# Patient Record
Sex: Female | Born: 1955 | Race: White | Hispanic: No | Marital: Married | Smoking: Former smoker
Health system: Southern US, Community
[De-identification: ages and names within clinical notes are randomized; demographics above are authoritative.]

## PROBLEM LIST (undated history)

## (undated) DIAGNOSIS — F909 Attention-deficit hyperactivity disorder, unspecified type: Secondary | ICD-10-CM

## (undated) DIAGNOSIS — Z5189 Encounter for other specified aftercare: Secondary | ICD-10-CM

## (undated) DIAGNOSIS — T7840XA Allergy, unspecified, initial encounter: Secondary | ICD-10-CM

## (undated) DIAGNOSIS — E039 Hypothyroidism, unspecified: Secondary | ICD-10-CM

## (undated) DIAGNOSIS — Z87442 Personal history of urinary calculi: Secondary | ICD-10-CM

## (undated) HISTORY — DX: Encounter for other specified aftercare: Z51.89

## (undated) HISTORY — PX: OTHER SURGICAL HISTORY: SHX169

## (undated) HISTORY — DX: Hypothyroidism, unspecified: E03.9

## (undated) HISTORY — DX: Personal history of urinary calculi: Z87.442

## (undated) HISTORY — DX: Attention-deficit hyperactivity disorder, unspecified type: F90.9

## (undated) HISTORY — DX: Allergy, unspecified, initial encounter: T78.40XA

---

## 1983-11-13 DIAGNOSIS — IMO0001 Reserved for inherently not codable concepts without codable children: Secondary | ICD-10-CM

## 1983-11-13 DIAGNOSIS — Z5189 Encounter for other specified aftercare: Secondary | ICD-10-CM

## 1983-11-13 HISTORY — DX: Encounter for other specified aftercare: Z51.89

## 1983-11-13 HISTORY — DX: Reserved for inherently not codable concepts without codable children: IMO0001

## 1983-11-13 HISTORY — PX: TONSILLECTOMY AND ADENOIDECTOMY: SUR1326

## 1998-07-28 ENCOUNTER — Ambulatory Visit (HOSPITAL_COMMUNITY): Admission: RE | Admit: 1998-07-28 | Discharge: 1998-07-28 | Payer: Self-pay | Admitting: Obstetrics & Gynecology

## 1998-10-17 ENCOUNTER — Other Ambulatory Visit: Admission: RE | Admit: 1998-10-17 | Discharge: 1998-10-17 | Payer: Self-pay | Admitting: Obstetrics & Gynecology

## 1998-11-18 ENCOUNTER — Other Ambulatory Visit: Admission: RE | Admit: 1998-11-18 | Discharge: 1998-11-18 | Payer: Self-pay | Admitting: Obstetrics & Gynecology

## 1999-03-28 ENCOUNTER — Other Ambulatory Visit: Admission: RE | Admit: 1999-03-28 | Discharge: 1999-03-28 | Payer: Self-pay | Admitting: Obstetrics & Gynecology

## 2000-11-01 ENCOUNTER — Encounter: Payer: Self-pay | Admitting: Internal Medicine

## 2001-09-19 ENCOUNTER — Other Ambulatory Visit: Admission: RE | Admit: 2001-09-19 | Discharge: 2001-09-19 | Payer: Self-pay | Admitting: Obstetrics and Gynecology

## 2001-09-23 ENCOUNTER — Encounter: Payer: Self-pay | Admitting: Obstetrics and Gynecology

## 2001-09-23 ENCOUNTER — Encounter: Admission: RE | Admit: 2001-09-23 | Discharge: 2001-09-23 | Payer: Self-pay | Admitting: Obstetrics and Gynecology

## 2002-01-11 ENCOUNTER — Emergency Department (HOSPITAL_COMMUNITY): Admission: EM | Admit: 2002-01-11 | Discharge: 2002-01-11 | Payer: Self-pay | Admitting: Emergency Medicine

## 2002-01-11 ENCOUNTER — Encounter: Payer: Self-pay | Admitting: Urology

## 2002-02-04 ENCOUNTER — Ambulatory Visit (HOSPITAL_BASED_OUTPATIENT_CLINIC_OR_DEPARTMENT_OTHER): Admission: RE | Admit: 2002-02-04 | Discharge: 2002-02-04 | Payer: Self-pay | Admitting: Urology

## 2002-02-04 ENCOUNTER — Encounter: Payer: Self-pay | Admitting: Urology

## 2003-02-17 ENCOUNTER — Encounter: Payer: Self-pay | Admitting: Obstetrics and Gynecology

## 2003-02-17 ENCOUNTER — Ambulatory Visit (HOSPITAL_COMMUNITY): Admission: RE | Admit: 2003-02-17 | Discharge: 2003-02-17 | Payer: Self-pay | Admitting: Obstetrics and Gynecology

## 2003-02-18 ENCOUNTER — Encounter: Payer: Self-pay | Admitting: Obstetrics and Gynecology

## 2003-02-18 ENCOUNTER — Encounter: Admission: RE | Admit: 2003-02-18 | Discharge: 2003-02-18 | Payer: Self-pay | Admitting: Obstetrics and Gynecology

## 2003-03-01 ENCOUNTER — Encounter: Admission: RE | Admit: 2003-03-01 | Discharge: 2003-03-01 | Payer: Self-pay | Admitting: Obstetrics and Gynecology

## 2003-03-01 ENCOUNTER — Encounter: Payer: Self-pay | Admitting: Obstetrics and Gynecology

## 2003-11-17 ENCOUNTER — Other Ambulatory Visit: Admission: RE | Admit: 2003-11-17 | Discharge: 2003-11-17 | Payer: Self-pay | Admitting: Obstetrics and Gynecology

## 2003-11-19 ENCOUNTER — Encounter: Admission: RE | Admit: 2003-11-19 | Discharge: 2003-11-19 | Payer: Self-pay | Admitting: Obstetrics and Gynecology

## 2004-12-06 ENCOUNTER — Ambulatory Visit: Payer: Self-pay | Admitting: Internal Medicine

## 2005-01-17 ENCOUNTER — Other Ambulatory Visit: Admission: RE | Admit: 2005-01-17 | Discharge: 2005-01-17 | Payer: Self-pay | Admitting: Obstetrics and Gynecology

## 2005-01-22 ENCOUNTER — Encounter: Admission: RE | Admit: 2005-01-22 | Discharge: 2005-01-22 | Payer: Self-pay | Admitting: Obstetrics and Gynecology

## 2005-03-12 ENCOUNTER — Ambulatory Visit: Payer: Self-pay | Admitting: Internal Medicine

## 2005-08-16 ENCOUNTER — Ambulatory Visit: Payer: Self-pay | Admitting: Internal Medicine

## 2005-08-17 ENCOUNTER — Encounter: Admission: RE | Admit: 2005-08-17 | Discharge: 2005-08-17 | Payer: Self-pay | Admitting: Internal Medicine

## 2005-10-16 ENCOUNTER — Ambulatory Visit: Payer: Self-pay | Admitting: Internal Medicine

## 2005-10-17 ENCOUNTER — Ambulatory Visit: Payer: Self-pay | Admitting: *Deleted

## 2006-02-13 ENCOUNTER — Other Ambulatory Visit: Admission: RE | Admit: 2006-02-13 | Discharge: 2006-02-13 | Payer: Self-pay | Admitting: Obstetrics and Gynecology

## 2006-04-10 ENCOUNTER — Encounter: Admission: RE | Admit: 2006-04-10 | Discharge: 2006-04-10 | Payer: Self-pay | Admitting: Obstetrics and Gynecology

## 2007-02-27 ENCOUNTER — Other Ambulatory Visit: Admission: RE | Admit: 2007-02-27 | Discharge: 2007-02-27 | Payer: Self-pay | Admitting: Obstetrics and Gynecology

## 2007-05-13 ENCOUNTER — Ambulatory Visit: Payer: Self-pay | Admitting: Internal Medicine

## 2007-05-13 LAB — CONVERTED CEMR LAB
Albumin: 3.9 g/dL (ref 3.5–5.2)
Alkaline Phosphatase: 45 units/L (ref 39–117)
BUN: 11 mg/dL (ref 6–23)
Basophils Relative: 0.7 % (ref 0.0–1.0)
Eosinophils Relative: 2.1 % (ref 0.0–5.0)
GFR calc Af Amer: 217 mL/min
GFR calc non Af Amer: 180 mL/min
HCT: 39 % (ref 36.0–46.0)
Hemoglobin: 13.3 g/dL (ref 12.0–15.0)
LDL Cholesterol: 38 mg/dL (ref 0–99)
Monocytes Absolute: 0.7 10*3/uL (ref 0.2–0.7)
Monocytes Relative: 12.2 % — ABNORMAL HIGH (ref 3.0–11.0)
Neutrophils Relative %: 50.7 % (ref 43.0–77.0)
Potassium: 4.5 meq/L (ref 3.5–5.1)
RDW: 11.4 % — ABNORMAL LOW (ref 11.5–14.6)
Sodium: 142 meq/L (ref 135–145)
VLDL: 12 mg/dL (ref 0–40)
WBC: 5.8 10*3/uL (ref 4.5–10.5)

## 2007-06-20 ENCOUNTER — Ambulatory Visit: Payer: Self-pay | Admitting: Internal Medicine

## 2007-06-20 DIAGNOSIS — E039 Hypothyroidism, unspecified: Secondary | ICD-10-CM

## 2007-06-20 DIAGNOSIS — F909 Attention-deficit hyperactivity disorder, unspecified type: Secondary | ICD-10-CM | POA: Insufficient documentation

## 2007-06-20 DIAGNOSIS — J45909 Unspecified asthma, uncomplicated: Secondary | ICD-10-CM | POA: Insufficient documentation

## 2007-06-20 DIAGNOSIS — R7309 Other abnormal glucose: Secondary | ICD-10-CM | POA: Insufficient documentation

## 2007-06-20 DIAGNOSIS — Z87442 Personal history of urinary calculi: Secondary | ICD-10-CM | POA: Insufficient documentation

## 2007-06-20 DIAGNOSIS — J309 Allergic rhinitis, unspecified: Secondary | ICD-10-CM | POA: Insufficient documentation

## 2007-06-20 HISTORY — DX: Hypothyroidism, unspecified: E03.9

## 2007-06-20 LAB — HM MAMMOGRAPHY

## 2007-08-13 ENCOUNTER — Encounter: Admission: RE | Admit: 2007-08-13 | Discharge: 2007-08-13 | Payer: Self-pay | Admitting: Obstetrics and Gynecology

## 2007-12-23 ENCOUNTER — Ambulatory Visit: Payer: Self-pay | Admitting: Internal Medicine

## 2007-12-28 LAB — CONVERTED CEMR LAB
Hgb A1c MFr Bld: 5.3 % (ref 4.6–6.0)
TSH: 0.03 microintl units/mL — ABNORMAL LOW (ref 0.35–5.50)

## 2007-12-30 ENCOUNTER — Ambulatory Visit: Payer: Self-pay | Admitting: Internal Medicine

## 2008-05-07 ENCOUNTER — Other Ambulatory Visit: Admission: RE | Admit: 2008-05-07 | Discharge: 2008-05-07 | Payer: Self-pay | Admitting: Obstetrics and Gynecology

## 2008-06-18 ENCOUNTER — Ambulatory Visit: Payer: Self-pay | Admitting: Internal Medicine

## 2008-06-18 ENCOUNTER — Telehealth: Payer: Self-pay | Admitting: *Deleted

## 2008-06-18 DIAGNOSIS — R222 Localized swelling, mass and lump, trunk: Secondary | ICD-10-CM | POA: Insufficient documentation

## 2008-06-18 DIAGNOSIS — M954 Acquired deformity of chest and rib: Secondary | ICD-10-CM | POA: Insufficient documentation

## 2008-06-21 ENCOUNTER — Telehealth: Payer: Self-pay | Admitting: Family Medicine

## 2008-07-13 ENCOUNTER — Ambulatory Visit: Payer: Self-pay | Admitting: Internal Medicine

## 2008-07-14 ENCOUNTER — Ambulatory Visit: Payer: Self-pay | Admitting: Internal Medicine

## 2009-04-26 ENCOUNTER — Encounter: Admission: RE | Admit: 2009-04-26 | Discharge: 2009-04-26 | Payer: Self-pay | Admitting: Obstetrics and Gynecology

## 2009-05-11 ENCOUNTER — Other Ambulatory Visit: Admission: RE | Admit: 2009-05-11 | Discharge: 2009-05-11 | Payer: Self-pay | Admitting: Obstetrics and Gynecology

## 2010-04-25 ENCOUNTER — Ambulatory Visit: Payer: Self-pay | Admitting: Internal Medicine

## 2010-04-26 LAB — CONVERTED CEMR LAB
AST: 27 units/L (ref 0–37)
Albumin: 4 g/dL (ref 3.5–5.2)
Basophils Absolute: 0.1 10*3/uL (ref 0.0–0.1)
Bilirubin Urine: NEGATIVE
CO2: 31 meq/L (ref 19–32)
Eosinophils Absolute: 0.1 10*3/uL (ref 0.0–0.7)
Glucose, Bld: 101 mg/dL — ABNORMAL HIGH (ref 70–99)
HDL: 83.9 mg/dL (ref >39.00)
Hemoglobin, Urine: NEGATIVE
Ketones, ur: NEGATIVE mg/dL
Leukocytes, UA: NEGATIVE
Lymphs Abs: 1.6 10*3/uL (ref 0.7–4.0)
MCV: 85.9 fL (ref 78.0–100.0)
Monocytes Absolute: 0.5 10*3/uL (ref 0.1–1.0)
Monocytes Relative: 10.1 % (ref 3.0–12.0)
Neutro Abs: 2.6 10*3/uL (ref 1.4–7.7)
Nitrite: NEGATIVE
Platelets: 251 10*3/uL (ref 150.0–400.0)
Potassium: 4.4 meq/L (ref 3.5–5.1)
RDW: 12.9 % (ref 11.5–14.6)
Sodium: 141 meq/L (ref 135–145)
Specific Gravity, Urine: >=1.03 (ref 1.000–1.030)
TSH: 0.06 microintl units/mL — ABNORMAL LOW (ref 0.35–5.50)
Total Bilirubin: 0.8 mg/dL (ref 0.3–1.2)
Total CHOL/HDL Ratio: 2
Triglycerides: 28 mg/dL (ref 0.0–149.0)
Urobilinogen, UA: 1 (ref 0.0–1.0)
VLDL: 5.6 mg/dL (ref 0.0–40.0)
WBC: 4.9 10*3/uL (ref 4.5–10.5)
pH: 6 (ref 5.0–8.0)

## 2010-05-02 ENCOUNTER — Ambulatory Visit: Payer: Self-pay | Admitting: Internal Medicine

## 2010-05-02 DIAGNOSIS — M549 Dorsalgia, unspecified: Secondary | ICD-10-CM | POA: Insufficient documentation

## 2010-10-11 ENCOUNTER — Encounter: Admission: RE | Admit: 2010-10-11 | Discharge: 2010-10-11 | Payer: Self-pay | Admitting: Obstetrics and Gynecology

## 2010-12-14 NOTE — Assessment & Plan Note (Signed)
Summary: cpx/cjr   Vital Signs:  Patient profile:   55 year old female Menstrual status:  perimenopausal LMP:     10/24/2009 Height:      65.5 inches Weight:      141 pounds BMI:     23.19 Pulse rate:   72 / minute BP sitting:   110 / 70  (right arm) Cuff size:   regular  Vitals Entered By: Romualdo Bolk, CMA (AAMA) (May 02, 2010 1:54 PM) CC: CPX no pap-Pt has gyn who does paps LMP (date): 10/24/2009     Menstrual Status perimenopausal Enter LMP: 10/24/2009 Last PAP Result normal   History of Present Illness: Misty Frederick comes in today   for preventive visit . Since last visit  here  there have been no major changes in health status  .  Thyroid:Dr [facility] yearly no recent change dose. Resp : stable   no asthma  NOw over 50 and never had colonsocopy.  Preventive Care Screening  Pap Smear:    Date:  04/12/2009    Results:  normal   Mammogram:    Date:  03/12/2009    Results:  normal   Prior Values:    Last Tetanus Booster:  Tdap (06/20/2007)   Preventive Screening-Counseling & Management  Alcohol-Tobacco     Alcohol drinks/day: 0     Smoking Status: quit     Year Quit: early 20's  Caffeine-Diet-Exercise     Caffeine use/day: less than 1 a day     Does Patient Exercise: yes     Type of exercise: Bike     Times/week: 3  Hep-HIV-STD-Contraception     Dental Visit-last 6 months yes     Sun Exposure-Excessive: no  Safety-Violence-Falls     Seat Belt Use: yes     Firearms in the Home: no firearms in the home     Smoke Detectors: yes  Current Medications (verified): 1)  Synthroid 137 Mcg Tabs (Levothyroxine Sodium)  Allergies (verified): No Known Drug Allergies  Past History:  Past Medical History: Allergic rhinitis Asthma  quiescent  Hypothyroidism Childbirth Nephrolithiasis, hx of  Peterson ADHD   ? endometriosis  CONSULTANTS   [facility]  Past History:  Care Management: Endocrinology: [facility] Gynecology: [facility] ( DR  Thomasena Edis retired  moving )  Family History: Family History of CAD Female 1st degree relative <60  Mom  Both smoked. Mom has had an amputation for PVD, AAA repari Sis with kidney stones. and PVD   smokes  sis  WPW and neice    Social History: Former remote  Smoker hhof 2    pet dogs  Regular exercise-yes single mom     Caffeine use/day:  less than 1 a day Seat Belt Use:  yes Dental Care w/in 6 mos.:  yes Sun Exposure-Excessive:  no  Review of Systems  The patient denies anorexia, fever, weight loss, weight gain, vision loss, decreased hearing, hoarseness, chest pain, syncope, dyspnea on exertion, peripheral edema, prolonged cough, abdominal pain, melena, hematochezia, severe indigestion/heartburn, hematuria, transient blindness, difficulty walking, abnormal bleeding, enlarged lymph nodes, and angioedema.         back pain  chronic   no acute problem .   no recent renal stone.    Physical Exam General Appearance: well developed, well nourished, no acute distress Eyes: conjunctiva and lids normal, PERRLA, EOMI, WNL Ears, Nose, Mouth, Throat: TM clear, nares clear, oral exam WNL Neck: supple, no lymphadenopathy, no thyromegaly, no JVD Respiratory: clear to auscultation  and percussion, respiratory effort normal Cardiovascular: regular rate and rhythm, S1-S2, no murmur, rub or gallop, no bruits, peripheral pulses normal and symmetric, no cyanosis, clubbing, edema or varicosities  no bruits noted fem abd or carotid s Chest: no scars, masses, tenderness; no asymmetry, skin changes, nipple discharge   Gastrointestinal: soft, non-tender; no hepatosplenomegaly, masses; active bowel sounds all quadrants,  Genitourinary: per gyne  Lymphatic: no cervical, axillary or inguinal adenopathy Musculoskeletal: gait normal, muscle tone and strength WNL, no joint swelling, effusions, discoloration, crepitus  Skin: clear, good turgor, color WNL, no rashes, lesions, or ulcerations Neurologic: normal mental  status, normal reflexes, normal strength, sensation, and motion Psychiatric: alert; oriented to person, place and time Other Exam:  see labs     Impression & Recommendations:  Problem # 1:  PREVENTIVE HEALTH CARE (ICD-V70.0) Discussed nutrition,exercise,diet,healthy weight, vitamin D and calcium.  need colonsocopy   Problem # 2:  HYPOTHYROIDISM (ICD-244.9)  oversuppressed ? today   send copy of labs to Dr Evlyn Kanner  for poss adjustment Her updated medication list for this problem includes:    Synthroid 137 Mcg Tabs (Levothyroxine sodium)  Labs Reviewed: TSH: 0.06 (04/25/2010)    HgBA1c: 5.3 (12/23/2007) Chol: 137 (04/25/2010)   HDL: 83.90 (04/25/2010)   LDL: 48 (04/25/2010)   TG: 28.0 (04/25/2010)  Problem # 3:  FAMILY HISTORY OF CAD FEMALE 1ST DEGREE RELATIVE <60 (ICD-V16.49) strong fam hx of  PVD mom and sib ( both smokers)  and AAA GM   NOevidence on exam today an lipids are good . could get screen via  if wishes  Problem # 4:  HYPERGLYCEMIA, FASTING (ICD-790.29) Assessment: Improved  prevention   lifestyle intervention   Complete Medication List: 1)  Synthroid 137 Mcg Tabs (Levothyroxine sodium)  Patient Instructions: 1)  Will send copy labs to Dr Evlyn Kanner   . 2)  It is important that you exercise reguarly at least 20 minutes 5 times a week. If you develop chest pain, have severe difficulty breathing, or feel very tired, stop exercising immediately and seek medical attention.  3)  Exercise  can be helpful for  memory preservation and cv health. 4)  Call  when want to do colonscopy  referral  Preventive Care Screening  Pap Smear:    Date:  04/12/2009    Results:  normal   Mammogram:    Date:  03/12/2009    Results:  normal   Prior Values:    Last Tetanus Booster:  Tdap (06/20/2007)

## 2010-12-27 ENCOUNTER — Encounter: Payer: Self-pay | Admitting: Cardiovascular Disease

## 2010-12-28 ENCOUNTER — Encounter: Payer: Self-pay | Admitting: Cardiovascular Disease

## 2010-12-28 ENCOUNTER — Ambulatory Visit (INDEPENDENT_AMBULATORY_CARE_PROVIDER_SITE_OTHER): Payer: Self-pay

## 2010-12-28 DIAGNOSIS — Z0000 Encounter for general adult medical examination without abnormal findings: Secondary | ICD-10-CM

## 2011-01-03 NOTE — Miscellaneous (Signed)
Summary: Orders Update  Clinical Lists Changes  Orders: Added new Test order of Vascular Screening  (Vas. screening) - Signed

## 2011-03-30 NOTE — Consult Note (Signed)
[place]  Patient:    Misty Frederick, Misty Frederick Visit Number: [id number] MRN: [id number]          Service Type: EMS Location: [facility] [place] Attending Physician:  Lorre Nick Dictated by:   Maretta Bees. Vonita Moss, M.D. Proc. Date: 01/11/02 Admit Date:  01/11/2002                            Consultation Report  I was asked to see this 55 year old white female at the request of Dr. Lorre Nick and his P.A. for evaluation of left flank pain.  She presented with severe pain this morning in the emergency room and was given Toradol and morphine.  She has had about a month history of some intermittent mild bladder symptoms and hematuria and she has been treated with Septra and Cipro for possible UTI by Dr. Berniece Andreas.  She did have a stone several years ago that was suspected clinically, although she never actually passed a stone.  Earlier this week there was left flank pain.  She had a CT scan at [facility] and apparently showed a 7 mm stone.  She has been taking some hydrocodone for pain.  She has had no fever through all this time.  She has had no long history of UTIs before.  She presented this morning and was admitted with severe left flank pain and a CT scan here showed a 4-5 mm stone at the L4-5 level.  I got a KUB and it showed a very faint stone about 3-4 mm in size at the level of L4.  PHYSICAL EXAMINATION  GENERAL:  She is alert and oriented.  She had some nausea, but no abdominal or CVA tenderness.  SOCIAL HISTORY:  She does not smoke or drink alcohol.  FAMILY HISTORY:  No history of stones.  ALLERGIES:  Denied.  MEDICATIONS:  None.  IMPRESSION: 1. A 4 mm left ureteral calculus with some obstruction. 2. Hematuria due to #1. 3. Rule out urinary tract infection.  PLAN:  I had a discussion with the patient and her sister and her husband and we concluded the stone is a passable sized stone.  It may be difficult to manipulate the stone  out of this level at this point.  She has no evidence of pyelonephritis.  She is very responsible in that regard.  We have chosen to get her on Mepergan Forte for pain and Vioxx as a substitute pain medication so that she could work if she had milder pain.  She will let me know if she gets continued pain or fever and will proceed endoscopically with a procedure as appropriate.  The stone is really too small and faint to be a candidate for lithotripsy.  Will also, after getting a clean catch urine culture done, send her home on cephalexin.  I will see her in the office in the middle of the week. Dictated by:   Maretta Bees. Vonita Moss, M.D. Attending Physician:  Lorre Nick DD:  01/11/02 TD:  01/12/02 Job: [id number]

## 2011-03-30 NOTE — Op Note (Signed)
[facility]  Patient:    NGOC, DETJEN Visit Number: [id number] MRN: [id number]          Service Type: EMS Location: [facility] [place] Attending Physician:  Lorre Nick Dictated by:   Maretta Bees. Vonita Moss, M.D. Proc. Date: 02/04/02 Admit Date:  01/11/2002 Discharge Date: 01/11/2002                             Operative Report  PREOPERATIVE DIAGNOSIS:  Distal left ureteral calculus.  POSTOPERATIVE DIAGNOSIS:  Distal left ureteral calculus.  PROCEDURE:  Cystoscopy, left ureteroscopy and ureteroscopic stone basketing and left retrograde pyelogram with interpretation.  SURGEON:  Maretta Bees. Vonita Moss, M.D.  ANESTHESIA:  General.  INDICATIONS FOR PROCEDURE:  This 55 year old lady has had almost a month of intermittent left flank pain and a stone initially was up at L4-5 level is now down in the distal left ureter but has been staying down for several days and she has had persistent suprapubic pressure and opted for ureteroscopic removal at this time.  DESCRIPTION OF PROCEDURE:  The patient was brought to the operating room, placed in lithotomy position, external genitalia were prepped and draped in the usual fashion. She was cystoscoped and there was some edema around the left ureteral orifice. A metal guidewire would not go up the ureter but a Glidewire M did and then I exchanged that out using an open ended ureteral catheter for a middle guidewire. I then inserted the 6 French ureteroscope and encountered the soft yellow crumbly stone and grasped the stone with a Nitinol stone basket and it broke in half and I removed 1/2 and then removed the second half and then a lot of pieces crumbled in the bladder and later I was able to retrieve some of the small fragments and give to the patient. I then reinserted the ureteroscope up to the left ureter and there were no residual stones and no injury to the ureter. I then did a left retrograde pyelogram which showed  slight fullness of the ureter but no extravasation or any other problems. I removed the ureteral catheter and then I cystoscoped her and retrieved a couple of tiny stone fragments and emptied the bladder, removed the cystoscope and sent the patient to the recovery room in good condition. Dictated by:   Maretta Bees. Vonita Moss, M.D. Attending Physician:  Lorre Nick DD:  02/04/02 TD:  02/04/02 Job: [id number]

## 2011-04-10 ENCOUNTER — Ambulatory Visit (INDEPENDENT_AMBULATORY_CARE_PROVIDER_SITE_OTHER): Payer: Self-pay | Admitting: Internal Medicine

## 2011-04-10 ENCOUNTER — Encounter: Payer: Self-pay | Admitting: Internal Medicine

## 2011-04-10 DIAGNOSIS — H9319 Tinnitus, unspecified ear: Secondary | ICD-10-CM

## 2011-04-10 MED ORDER — FLUTICASONE PROPIONATE 50 MCG/ACT NA SUSP: 2.0000 | Freq: Every day | NASAL | Status: DC

## 2011-04-15 ENCOUNTER — Encounter: Payer: Self-pay | Admitting: Internal Medicine

## 2011-04-15 DIAGNOSIS — H9319 Tinnitus, unspecified ear: Secondary | ICD-10-CM | POA: Insufficient documentation

## 2011-04-15 NOTE — Progress Notes (Signed)
Subjective:    Patient ID: Misty Frederick, female    DOB: 11/19/1955, 55 y.o.   MRN: [id number]  HPI Patient presents to clinic for evaluation of tinnitus. Notes recent onset of right ear high-pitched popping sound at match as her heartbeat. States she has had an unremarkable carotid ultrasound within the past 6 months. Has no neurologic focal symptoms. Has no increase in nasal congestion drainage and denies fever chills or recent illness. No alleviating or exacerbating factors. No other complaints.  Reviewed past medical history, medications and allergies   Review of Systems  Constitutional: Negative for fever and chills.  HENT: Positive for tinnitus. Negative for hearing loss, ear pain, congestion and facial swelling.        Objective:   Physical Exam    Physical Exam  Vitals reviewed. Constitutional:  appears well-developed and well-nourished. No distress.  HENT:  Head: Normocephalic and atraumatic.  Right Ear: Tympanic membrane, external ear and ear canal normal.  Left Ear: Tympanic membrane, external ear and ear canal normal.  Nose: Nose normal. Nasal mucosal membranes with soft tissue swelling and mild erythema. Mouth/Throat: Oropharynx is clear and moist. No oropharyngeal exudate.  Eyes: Conjunctivae and EOM are normal. Pupils are equal, round, and reactive to light. Right eye exhibits no discharge. Left eye exhibits no discharge. No scleral icterus.  Neck: Neck supple. No thyromegaly present.  Cardiovascular: Normal rate, regular rhythm and normal heart sounds.  Exam reveals no gallop and no friction rub.   No murmur heard. Pulmonary/Chest: Effort normal and breath sounds normal. No respiratory distress.  has no wheezes.  has no rales.  Lymphadenopathy:   no cervical adenopathy.  Neurological:  is alert.  Skin: Skin is warm and dry.  not diaphoretic.  Psychiatric: normal mood and affect.     Assessment & Plan:

## 2011-04-15 NOTE — Assessment & Plan Note (Signed)
Questionably pulsatile however reportedly recent carotid ultrasound unremarkable. Physical exam findings suggest possible rhinitis. Begin Flonase 2 sprays each nostril q.h.s.Followup if no improvement or worsening.

## 2011-09-17 ENCOUNTER — Encounter: Payer: Self-pay | Admitting: Internal Medicine

## 2011-09-17 ENCOUNTER — Ambulatory Visit (INDEPENDENT_AMBULATORY_CARE_PROVIDER_SITE_OTHER): Payer: Managed Care, Other (non HMO) | Admitting: Internal Medicine

## 2011-09-17 VITALS — BP 120/70 | HR 60 | Wt 151.0 lb

## 2011-09-17 DIAGNOSIS — H6980 Other specified disorders of Eustachian tube, unspecified ear: Secondary | ICD-10-CM

## 2011-09-17 DIAGNOSIS — R200 Anesthesia of skin: Secondary | ICD-10-CM

## 2011-09-17 DIAGNOSIS — E039 Hypothyroidism, unspecified: Secondary | ICD-10-CM

## 2011-09-17 DIAGNOSIS — J309 Allergic rhinitis, unspecified: Secondary | ICD-10-CM

## 2011-09-17 DIAGNOSIS — R209 Unspecified disturbances of skin sensation: Secondary | ICD-10-CM

## 2011-09-17 NOTE — Assessment & Plan Note (Signed)
Last labs show suppressed tsh

## 2011-09-17 NOTE — Patient Instructions (Signed)
If  Persisting or getting worse  Over the nex months or so then call and get an opinion.  Can try  advil aleve for 10 days or so.   Begin the allergy. flonase     For the eustachian   tube dysfunction

## 2011-09-17 NOTE — Progress Notes (Signed)
Subjective:    Patient ID: Misty Frederick, female    DOB: [birth year], 55 y.o.   MRN: [id number]  HPI Patient comes in today  For new problems evaluation. See above  last ov with me cpx 6 2011  Right anterior thigh numbness for 2-3 weeks and no motor cahnges no pain until today ? Some  twing in back at times   Feels sx  when pullls leg up.  Denies new excessive exercise and no tight binding inguinal clothes.  No weakness or  Other numbness.  Ears still feel clogged at times and pop   Had recent treatment for ear infection.  No sneezing and off her flonase . No face pain or fever .     Thyroid and labs to review   Low tsh and to be  Managed per Dr Evlyn Kanner Review of Systems No fever vision change balance issue or abd pain or inguinal or hip pain. No leg giving out.   Past history family history social history reviewed in the electronic medical record.     Objective:   Physical Exam WDWN in NAD  quiet respirations; mildly congested  somewhat hoarse. Non toxic . HEENT: Normocephalic ;atraumatic , Eyes;  PERRL, EOMs  Full, lids and conjunctiva clear,,Ears: no deformities, canals nl, TM landmarks normal, nl redness or dullness  Nose: no deformity or discharge but congested;face non tender Mouth : OP clear without lesion or edema . Neck: Supple without adenopathy or masses or bruits Chest:  Clear to A&P without wheezes rales or rhonchi CV:  S1-S2 no gallops or murmurs peripheral perfusion is normal Skin :nl perfusion and no acute rashes  No clubbing cyanosis or edema No inguinal adenopathy  Pulses seem nl  Gait nl   Points to anterior  Upper thigh area of concern.  dtrs present le and no clonus.   Labs reviewed     Assessment & Plan:  Numbness right ant thigh  Poss femoral cutaneous nerve area  No other findings. course try short course of nsaid  If  persistent or progressive can get neuro opinion.  Ears sx prob eustach tube dysfunction    allergv rhinitis hx   Restart eh flonase for  now and fu iif needed Thyroid per dr Evlyn Kanner don't think related to above

## 2011-10-30 ENCOUNTER — Other Ambulatory Visit: Payer: Self-pay | Admitting: Internal Medicine

## 2011-10-30 DIAGNOSIS — Z1231 Encounter for screening mammogram for malignant neoplasm of breast: Secondary | ICD-10-CM

## 2011-11-27 ENCOUNTER — Ambulatory Visit
Admission: RE | Admit: 2011-11-27 | Discharge: 2011-11-27 | Disposition: A | Discharge status: Home / Self Care | Payer: Managed Care, Other (non HMO) | Source: Ambulatory Visit | Attending: Internal Medicine | Admitting: Internal Medicine

## 2011-11-27 DIAGNOSIS — Z1231 Encounter for screening mammogram for malignant neoplasm of breast: Secondary | ICD-10-CM

## 2012-02-25 ENCOUNTER — Encounter: Payer: Self-pay | Admitting: Internal Medicine

## 2012-03-17 ENCOUNTER — Ambulatory Visit (AMBULATORY_SURGERY_CENTER): Payer: Managed Care, Other (non HMO) | Admitting: *Deleted

## 2012-03-17 VITALS — Ht 67.0 in | Wt 136.0 lb

## 2012-03-17 DIAGNOSIS — Z1211 Encounter for screening for malignant neoplasm of colon: Secondary | ICD-10-CM

## 2012-03-17 MED ORDER — PEG-KCL-NACL-NASULF-NA ASC-C 100 G PO SOLR: ORAL | Status: DC

## 2012-03-31 ENCOUNTER — Encounter: Payer: Self-pay | Admitting: Internal Medicine

## 2012-03-31 ENCOUNTER — Ambulatory Visit (AMBULATORY_SURGERY_CENTER): Payer: Managed Care, Other (non HMO) | Admitting: Internal Medicine

## 2012-03-31 VITALS — BP 136/74 | HR 91 | Temp 95.6°F | Resp 20 | Ht 67.0 in | Wt 136.0 lb

## 2012-03-31 DIAGNOSIS — D126 Benign neoplasm of colon, unspecified: Secondary | ICD-10-CM

## 2012-03-31 DIAGNOSIS — Z1211 Encounter for screening for malignant neoplasm of colon: Secondary | ICD-10-CM

## 2012-03-31 MED ORDER — SODIUM CHLORIDE 0.9 % IV SOLN: 500.0000 mL | INTRAVENOUS | Status: DC

## 2012-03-31 NOTE — Progress Notes (Signed)
Patient did not have preoperative order for IV antibiotic SSI prophylaxis. (G8918)  Patient did not experience any of the following events: a burn prior to discharge; a fall within the facility; wrong site/side/patient/procedure/implant event; or a hospital transfer or hospital admission upon discharge from the facility. (G8907)  

## 2012-03-31 NOTE — Patient Instructions (Signed)
YOU HAD AN ENDOSCOPIC PROCEDURE TODAY AT THE [facility]: Refer to the procedure report that was given to you for any specific questions about what was found during the examination.  If the procedure report does not answer your questions, please call your gastroenterologist to clarify.  If you requested that your care partner not be given the details of your procedure findings, then the procedure report has been included in a sealed envelope for you to review at your convenience later.  YOU SHOULD EXPECT: Some feelings of bloating in the abdomen. Passage of more gas than usual.  Walking can help get rid of the air that was put into your GI tract during the procedure and reduce the bloating. If you had a lower endoscopy (such as a colonoscopy or flexible sigmoidoscopy) you may notice spotting of blood in your stool or on the toilet paper. If you underwent a bowel prep for your procedure, then you may not have a normal bowel movement for a few days.  DIET: Your first meal following the procedure should be a light meal and then it is ok to progress to your normal diet.  A half-sandwich or bowl of soup is an example of a good first meal.  Heavy or fried foods are harder to digest and may make you feel nauseous or bloated.  Likewise meals heavy in dairy and vegetables can cause extra gas to form and this can also increase the bloating.  Drink plenty of fluids but you should avoid alcoholic beverages for 24 hours.  ACTIVITY: Your care partner should take you home directly after the procedure.  You should plan to take it easy, moving slowly for the rest of the day.  You can resume normal activity the day after the procedure however you should NOT DRIVE or use heavy machinery for 24 hours (because of the sedation medicines used during the test).    SYMPTOMS TO REPORT IMMEDIATELY: A gastroenterologist can be reached at any hour.  During normal business hours, 8:30 AM to 5:00 PM Monday through Friday,  call [phone].  After hours and on weekends, please call the GI answering service at [phone] who will take a message and have the physician on call contact you.   Following lower endoscopy (colonoscopy or flexible sigmoidoscopy):  Excessive amounts of blood in the stool  Significant tenderness or worsening of abdominal pains  Swelling of the abdomen that is new, acute  Fever of 100F or higher    FOLLOW UP: If any biopsies were taken you will be contacted by phone or by letter within the next 1-3 weeks.  Call your gastroenterologist if you have not heard about the biopsies in 3 weeks.  Our staff will call the home number listed on your records the next business day following your procedure to check on you and address any questions or concerns that you may have at that time regarding the information given to you following your procedure. This is a courtesy call and so if there is no answer at the home number and we have not heard from you through the emergency physician on call, we will assume that you have returned to your regular daily activities without incident.  SIGNATURES/CONFIDENTIALITY: You and/or your care partner have signed paperwork which will be entered into your electronic medical record.  These signatures attest to the fact that that the information above on your After Visit Summary has been reviewed and is understood.  Full responsibility of the confidentiality   of this discharge information lies with you and/or your care-partner.     

## 2012-03-31 NOTE — Op Note (Signed)
[facility] [address] [place], [place]  [address]  COLONOSCOPY PROCEDURE REPORT  PATIENT:  Misty Frederick, Misty Frederick  MR#:  [id number] BIRTHDATE:  Apr 29, 1956, 55 yrs. old  GENDER:  female ENDOSCOPIST:  Wilhemina Bonito. Eda Keys, MD REF. BY:  .Direct / Self, PROCEDURE DATE:  03/31/2012 PROCEDURE:  Colonoscopy with snare polypectomy x 1 ASA CLASS:  Class II INDICATIONS:  Routine Risk Screening MEDICATIONS:   MAC sedation, administered by CRNA, propofol (Diprivan) 280 mg IV  DESCRIPTION OF PROCEDURE:   After the risks benefits and alternatives of the procedure were thoroughly explained, informed consent was obtained.  Digital rectal exam was performed and revealed no abnormalities.   The LB CF-[id number] endoscope was introduced through the anus and advanced to the cecum, which was identified by both the appendix and ileocecal valve, without limitations.  The quality of the prep was excellent, using MoviPrep.  The instrument was then slowly withdrawn as the colon was fully examined. <<PROCEDUREIMAGES>>  FINDINGS:  A51mm sessile polyp was found in the cecum and snared without cautery. Retrieval was successful.  Otherwise normal colonoscopy without other polyps, masses, vascular ectasias, or inflammatory changes.   Retroflexed views in the rectum revealed no abnormalities.    The time to cecum = 3:22  minutes. The scope was then withdrawn in  14:07  minutes from the cecum and the procedure completed.  COMPLICATIONS:  None  ENDOSCOPIC IMPRESSION: 1) 10mm Sessile polyp in the cecum - removed 2) Otherwise normal colonoscopy  RECOMMENDATIONS: 1) Repeat Colonoscopy in 3 years.  ______________________________ Wilhemina Bonito. Eda Keys, MD  CC:  Madelin Headings, MD;  The Patient  n. eSIGNED:   Wilhemina Bonito. Eda Keys at 03/31/2012 03:33 PM  Ramond Craver, [id number]

## 2012-04-01 ENCOUNTER — Telehealth: Payer: Self-pay

## 2012-04-01 NOTE — Telephone Encounter (Signed)
  Follow up Call-  Call back number 03/31/2012  Post procedure Call Back phone  # [phone]  Permission to leave phone message Yes     Patient questions:  Do you have a fever, pain , or abdominal swelling? no Pain Score  0 *  Have you tolerated food without any problems? yes  Have you been able to return to your normal activities? yes  Do you have any questions about your discharge instructions: Diet   no Medications  no Follow up visit  no  Do you have questions or concerns about your Care? no  Actions: * If pain score is 4 or above: No action needed, pain <4.

## 2012-04-08 ENCOUNTER — Encounter: Payer: Self-pay | Admitting: Internal Medicine

## 2012-05-07 ENCOUNTER — Other Ambulatory Visit (INDEPENDENT_AMBULATORY_CARE_PROVIDER_SITE_OTHER): Payer: Managed Care, Other (non HMO)

## 2012-05-07 DIAGNOSIS — Z0000 Encounter for general adult medical examination without abnormal findings: Secondary | ICD-10-CM

## 2012-05-07 LAB — LIPID PANEL
HDL: 93.8 mg/dL (ref >39.00)
Triglycerides: 21 mg/dL (ref 0.0–149.0)

## 2012-05-07 LAB — POCT URINALYSIS DIPSTICK
Bilirubin, UA: NEGATIVE
Blood, UA: NEGATIVE
Ketones, UA: NEGATIVE
Leukocytes, UA: NEGATIVE
pH, UA: 5.5

## 2012-05-07 LAB — BASIC METABOLIC PANEL
CO2: 31 mEq/L (ref 19–32)
Chloride: 106 mEq/L (ref 96–112)
Creatinine, Ser: 0.7 mg/dL (ref 0.4–1.2)
Sodium: 142 mEq/L (ref 135–145)

## 2012-05-07 LAB — CBC WITH DIFFERENTIAL/PLATELET
Basophils Relative: 1.1 % (ref 0.0–3.0)
Eosinophils Absolute: 0.1 10*3/uL (ref 0.0–0.7)
Hemoglobin: 13.2 g/dL (ref 12.0–15.0)
MCHC: 33 g/dL (ref 30.0–36.0)
MCV: 86.3 fl (ref 78.0–100.0)
Monocytes Absolute: 0.4 10*3/uL (ref 0.1–1.0)
Neutro Abs: 2.2 10*3/uL (ref 1.4–7.7)
RBC: 4.64 Mil/uL (ref 3.87–5.11)

## 2012-05-07 LAB — HEPATIC FUNCTION PANEL
Albumin: 3.9 g/dL (ref 3.5–5.2)
Total Protein: 6.5 g/dL (ref 6.0–8.3)

## 2012-05-13 ENCOUNTER — Encounter: Payer: Self-pay | Admitting: Internal Medicine

## 2012-05-13 ENCOUNTER — Ambulatory Visit (INDEPENDENT_AMBULATORY_CARE_PROVIDER_SITE_OTHER): Payer: Managed Care, Other (non HMO) | Admitting: Internal Medicine

## 2012-05-13 VITALS — BP 110/60 | HR 80 | Temp 98.4°F | Ht 65.75 in | Wt 137.0 lb

## 2012-05-13 DIAGNOSIS — Z8249 Family history of ischemic heart disease and other diseases of the circulatory system: Secondary | ICD-10-CM

## 2012-05-13 DIAGNOSIS — Z86010 Personal history of colonic polyps: Secondary | ICD-10-CM

## 2012-05-13 DIAGNOSIS — E039 Hypothyroidism, unspecified: Secondary | ICD-10-CM

## 2012-05-13 DIAGNOSIS — Z0000 Encounter for general adult medical examination without abnormal findings: Secondary | ICD-10-CM

## 2012-05-13 NOTE — Patient Instructions (Signed)
Your laboratory studies are good today. Healthy eating exercise and sleep. Recheck in one year or as needed we'll send a copy of her laboratory tests to Dr. Evlyn Kanner.

## 2012-05-13 NOTE — Progress Notes (Signed)
Subjective:    Patient ID: Misty Frederick, female    DOB: 1956/06/07, 56 y.o.   MRN: [id number]  HPI Patient comes in today for Preventive Health Care visit  No major change in health status since last visit . See ear issue  Wants to have check  No pain . Sees dr Evlyn Kanner for thyroid on meds    Review of Systems ROS:  GEN/ HEENT: No fever, significant weight changes sweats headaches vision problems hearing changes, CV/ PULM; No chest pain shortness of breath cough, syncope,edema  change in exercise tolerance. GI /GU: No adominal pain, vomiting, change in bowel habits. No blood in the stool. No significant GU symptoms. SKIN/HEME: ,no acute skin rashes suspicious lesions or bleeding. No lymphadenopathy, nodules, masses.  NEURO/ PSYCH:  No neurologic signs such as weakness numbness. No depression anxiety. IMM/ Allergy: No unusual infections.  Allergy .   REST of 12 system review negative except as per HPI Outpatient Encounter Prescriptions as of 05/13/2012  Medication Sig Dispense Refill  . levothyroxine (SYNTHROID, LEVOTHROID) 137 MCG tablet Take 112 mcg by mouth daily.       Marland Kitchen DISCONTD: fluticasone (FLONASE) 50 MCG/ACT nasal spray Place 2 sprays into the nose daily.  16 g  2   Past history family history social history reviewed in the electronic medical record.      Objective:   Physical Exam BP 110/60  Pulse 80  Temp 98.4 F (36.9 C) (Oral)  Ht 5' 5.75" (1.67 m)  Wt 137 lb (62.143 kg)  BMI 22.28 kg/m2  SpO2 96% Physical Exam: Vital signs reviewed OZD:GUYQ is a well-developed well-nourished alert cooperative  white female who appears her stated age in no acute distress.  HEENT: normocephalic atraumatic , Eyes: PERRL EOM's full, conjunctiva clear, Nares: paten,t no deformity discharge or tenderness., Ears: no deformity EAC's clear TMs with normal landmarks. Mouth: clear OP, no lesions, edema.  Moist mucous membranes. Dentition in adequate repair. NECK: supple without masses,  thyromegaly or bruits. CHEST/PULM:  Clear to auscultation and percussion breath sounds equal no wheeze , rales or rhonchi. No chest wall deformities or tenderness. CV: PMI is nondisplaced, S1 S2 no gallops, murmurs, rubs. Peripheral pulses are full without delay.No JVD .  ABDOMEN: Bowel sounds normal nontender  No guard or rebound, no hepato splenomegal no CVA tenderness.  No hernia. Extremtities:  No clubbing cyanosis or edema, no acute joint swelling or redness no focal atrophy NEURO:  Oriented x3, cranial nerves 3-12 appear to be intact, no obvious focal weakness,gait within normal limits no abnormal reflexes or asymmetrical SKIN: No acute rashes normal turgor, color, no bruising or petechiae. PSYCH: Oriented, good eye contact, no obvious depression anxiety, cognition and judgment appear normal. LN: no cervical axillary inguinal adenopathy    Lab Results  Component Value Date   WBC 4.4* 05/07/2012   HGB 13.2 05/07/2012   HCT 40.1 05/07/2012   PLT 240.0 05/07/2012   GLUCOSE 89 05/07/2012   CHOL 144 05/07/2012   TRIG 21.0 05/07/2012   HDL 93.80 05/07/2012   LDLCALC 46 05/07/2012   ALT 24 05/07/2012   AST 21 05/07/2012   NA 142 05/07/2012   K 4.2 05/07/2012   CL 106 05/07/2012   CREATININE 0.7 05/07/2012   BUN 15 05/07/2012   CO2 31 05/07/2012   TSH 1.06 05/07/2012   HGBA1C 5.3 12/23/2007       Assessment & Plan:  Preventive Health Care Counseled regarding healthy nutrition, exercise, sleep, injury prevention, calcium vit  d and healthy weight .   utd on colon and pap.  Pt sated that she only smoke 1/2ppd from age 68- 28 in HS and is not a chronic smoker  . explain the way  I think the data boxes work in [employer] . THyroid per dr Evlyn Kanner Hx of hyperglycemia   Good today  Continue lsi Family hx of PVD .

## 2012-05-18 ENCOUNTER — Encounter: Payer: Self-pay | Admitting: Internal Medicine

## 2012-05-18 DIAGNOSIS — Z860100 Personal history of colon polyps, unspecified: Secondary | ICD-10-CM | POA: Insufficient documentation

## 2012-05-18 DIAGNOSIS — Z0000 Encounter for general adult medical examination without abnormal findings: Secondary | ICD-10-CM | POA: Insufficient documentation

## 2012-05-18 DIAGNOSIS — Z8249 Family history of ischemic heart disease and other diseases of the circulatory system: Secondary | ICD-10-CM | POA: Insufficient documentation

## 2012-08-07 ENCOUNTER — Other Ambulatory Visit: Payer: Self-pay | Admitting: Obstetrics and Gynecology

## 2012-08-22 ENCOUNTER — Ambulatory Visit (INDEPENDENT_AMBULATORY_CARE_PROVIDER_SITE_OTHER): Payer: Managed Care, Other (non HMO) | Admitting: Internal Medicine

## 2012-08-22 ENCOUNTER — Encounter: Payer: Self-pay | Admitting: Internal Medicine

## 2012-08-22 VITALS — BP 122/70 | HR 80 | Temp 98.4°F | Wt 137.0 lb

## 2012-08-22 DIAGNOSIS — Z87442 Personal history of urinary calculi: Secondary | ICD-10-CM

## 2012-08-22 DIAGNOSIS — R109 Unspecified abdominal pain: Secondary | ICD-10-CM

## 2012-08-22 DIAGNOSIS — R82998 Other abnormal findings in urine: Secondary | ICD-10-CM

## 2012-08-22 DIAGNOSIS — J45909 Unspecified asthma, uncomplicated: Secondary | ICD-10-CM

## 2012-08-22 DIAGNOSIS — R21 Rash and other nonspecific skin eruption: Secondary | ICD-10-CM

## 2012-08-22 DIAGNOSIS — T783XXA Angioneurotic edema, initial encounter: Secondary | ICD-10-CM

## 2012-08-22 DIAGNOSIS — R8290 Unspecified abnormal findings in urine: Secondary | ICD-10-CM

## 2012-08-22 LAB — POCT URINALYSIS DIPSTICK
Glucose, UA: NEGATIVE
Spec Grav, UA: 1.02
Urobilinogen, UA: 0.2

## 2012-08-22 MED ORDER — METHYLPREDNISOLONE ACETATE 80 MG/ML IJ SUSP
120.0000 mg | Freq: Once | INTRAMUSCULAR | Status: AC
  Administered 2012-08-22: 120 mg via INTRAMUSCULAR

## 2012-08-22 NOTE — Patient Instructions (Addendum)
I agree this is an allergic reaction. Continue on the Benadryl 25 mg every 4-6 hours to avoid breakthrough you could take 50 mg at night but will make you extra drowsy.  Steroid injection today. Do not take this medication in the future.  Delay the flu shot because we are giving the steroids today. Contact medical help if you're getting shortness of breath coughing increasing swelling but I suspect that we'll get better by the end of the weekend.  Allergic Reaction, Mild to Moderate Allergies may happen from anything your body is sensitive to. This may be food, medications, pollens, chemicals, and nearly anything around you in everyday life that produces allergens. An allergen is anything that causes an allergy producing substance. Allergens cause your body to release allergic antibodies. Through a chain of events, they cause a release of histamine into the blood stream. Histamines are meant to protect you, but they also cause your discomfort. This is why antihistamines are often used for allergies. Heredity is often a factor in causing allergic reactions. This means you may have some of the same allergies as your parents. Allergies happen in all age groups. You may have some idea of what caused your reaction. There are many allergens around [place]. It may be difficult to know what caused your reaction. If this is a first time event, it may never happen again. Allergies cannot be cured but can be controlled with medications. SYMPTOMS  You may get some or all of the following problems from allergies.  Swelling and itching in and around the mouth.   Tearing, itchy eyes.   Nasal congestion and runny nose.   Sneezing and coughing.   An itchy red rash or hives.   Vomiting or diarrhea.   Difficulty breathing.  Seasonal allergies occur in all age groups. They are seasonal because they usually occur during the same season every year. They may be a reaction to molds, grass pollens, or tree pollens. Other  causes of allergies are house dust mite allergens, pet dander and mold spores. These are just a common few of the thousands of allergens around [place]. All of the symptoms listed above happen when you come in contact with pollens and other allergens. Seasonal allergies are usually not life threatening. They are generally more of a nuisance that can often be handled using medications. Hay fever is a combination of all or some of the above listed allergy problems. It may often be treated with simple over-the-counter medications such as diphenhydramine. Take medication as directed. Check with your caregiver or package insert for child dosages. TREATMENT AND HOME CARE INSTRUCTIONS If hives or rash are present:  Take medications as directed.   You may use an over-the-counter antihistamine (diphenhydramine) for hives and itching as needed. Do not drive or drink alcohol until medications used to treat the reaction have worn off. Antihistamines tend to make people sleepy.   Apply cold cloths (compresses) to the skin or take baths in cool water. This will help itching. Avoid hot baths or showers. Heat will make a rash and itching worse.   If your allergies persist and become more severe, and over the counter medications are not effective, there are many new medications your caretaker can prescribe. Immunotherapy or desensitizing injections can be used if all else fails. Follow up with your caregiver if problems continue.  SEEK MEDICAL CARE IF:   Your allergies are becoming progressively more troublesome.   You suspect a food allergy. Symptoms generally happen within 30 minutes  of eating a food.   Your symptoms have not gone away within 2 days or are getting worse.   You develop new symptoms.   You want to retest yourself or your child with a food or drink you think causes an allergic reaction. Never test yourself or your child of a suspected allergy without being under the watchful eye of your caregivers. A  second exposure to an allergen may be life-threatening.  SEEK IMMEDIATE MEDICAL CARE IF:  You develop difficulty breathing or wheezing, or have a tight feeling in your chest or throat.   You develop a swollen mouth, hives, swelling, or itching all over your body.  A severe reaction with any of the above problems should be considered life-threatening. If you suddenly develop difficulty breathing call for local emergency medical help. THIS IS AN EMERGENCY. MAKE SURE YOU:   Understand these instructions.   Will watch your condition.   Will get help right away if you are not doing well or get worse.  Document Released: 08/26/2007 Document Revised: 10/18/2011 Document Reviewed: 08/26/2007 [facility] Patient Information 2012 [place], [place].

## 2012-08-23 ENCOUNTER — Encounter: Payer: Self-pay | Admitting: Internal Medicine

## 2012-08-23 DIAGNOSIS — T783XXA Angioneurotic edema, initial encounter: Secondary | ICD-10-CM | POA: Insufficient documentation

## 2012-08-23 DIAGNOSIS — R8290 Unspecified abnormal findings in urine: Secondary | ICD-10-CM | POA: Insufficient documentation

## 2012-08-23 NOTE — Progress Notes (Signed)
Subjective:    Patient ID: Misty Frederick, female    DOB: 1955/11/20, 56 y.o.   MRN: [id number]  HPI Patient comes in today for SDA for  new acute problem evaluation. ONset as above itchy hive type rash and had lip swelling in past 24 hours that got better with benadryl 255 mg  Rash is waxing and waning   But worse in the past 24 hours . No sob but feels not right in chest no wheezing unsure if any throat sx as is very aware of reaction. Asthma seems stable  Never had reaction like this doesn't take much med  Was seen by GYNE and had pap done nl noted blood in UA and was given macrobid 100 bid for 7 days  Last day was 36-48 hours ago and began having itching all over ; then rash and lip swelling as above.  Review of Systems No sob wheeze fever chills or gross hematuria?   Hx of stones non recently has some reflux like chest pain  Past history family history social history reviewed in the electronic medical record. No meds otherwise except synthroid   Objective:   Physical Exam BP 122/70  Pulse 80  Temp 98.4 F (36.9 C) (Oral)  Wt 137 lb (62.143 kg)  SpO2 97%' WDWN in nad   HEENT: Normocephalic ;atraumatic , Eyes;  PERRL, EOMs  Full, lids and conjunctiva clear,,Ears: no deformities, canals nl, TM landmarks normal, Nose: no deformity or discharge  Mouth : OP clear without lesion or edema . Neck: Supple without adenopathy or masses or bruits. Chest:  Clear to A&P without wheezes rales or rhonchi CV:  S1-S2 no gallops or murmurs peripheral perfusion is normal Abdomen:  Sof,t normal bowel sounds without hepatosplenomegaly, no guarding rebound or masses no CVA tenderness SKIN hive type rash arms trunk and neck current non on face.  No bruising petechia. No clubbing cyanosis or edema  UA 1+ leuk  Will do UR cx    Assessment & Plan:  Allergic reaction with angioedema  presumed from macrobid    Responsive to benadryl but because of severity and swelling hx  depomedrol today and continue  benadryl  And  close monitor   See care with alarm features  Or if not getting better after weekend .  Abn UA  Poss insig without sx   Sp rx for  uti from gyne  ? If ever had a sx or just abd UA  Urine culture pending  Hx of renal stone stable no sx of such today  Hx of asthma seems stable

## 2012-08-24 LAB — URINE CULTURE
Colony Count: NO GROWTH
Organism ID, Bacteria: NO GROWTH

## 2012-08-26 ENCOUNTER — Encounter: Payer: Self-pay | Admitting: Family Medicine

## 2012-12-27 ENCOUNTER — Other Ambulatory Visit: Payer: Self-pay

## 2013-04-07 ENCOUNTER — Ambulatory Visit (INDEPENDENT_AMBULATORY_CARE_PROVIDER_SITE_OTHER): Payer: Managed Care, Other (non HMO) | Admitting: Internal Medicine

## 2013-04-07 ENCOUNTER — Encounter: Payer: Self-pay | Admitting: Internal Medicine

## 2013-04-07 VITALS — BP 100/70 | HR 67 | Temp 98.1°F | Wt 139.0 lb

## 2013-04-07 DIAGNOSIS — M79609 Pain in unspecified limb: Secondary | ICD-10-CM

## 2013-04-07 DIAGNOSIS — M79606 Pain in leg, unspecified: Secondary | ICD-10-CM | POA: Insufficient documentation

## 2013-04-07 DIAGNOSIS — B029 Zoster without complications: Secondary | ICD-10-CM

## 2013-04-07 DIAGNOSIS — M79604 Pain in right leg: Secondary | ICD-10-CM

## 2013-04-07 MED ORDER — VALACYCLOVIR HCL 1 G PO TABS: 1000.0000 mg | ORAL_TABLET | Freq: Three times a day (TID) | ORAL | Status: DC

## 2013-04-07 NOTE — Patient Instructions (Signed)
I agree this acts like  early shingles. Take the Valtrex as soon as possible four-week ibuprofen for pain is fine.  If the pain is not adequately controlled contact her office we can consider using Lyrica or Neurontin for pain.   Shingles Shingles (herpes zoster) is an infection that is caused by the same virus that causes chickenpox (varicella). The infection causes a painful skin rash and fluid-filled blisters, which eventually break open, crust over, and heal. It may occur in any area of the body, but it usually affects only one side of the body or face. The pain of shingles usually lasts about 1 month. However, some people with shingles may develop long-term (chronic) pain in the affected area of the body. Shingles often occurs many years after the person had chickenpox. It is more common:  In people older than 50 years.  In people with weakened immune systems, such as those with HIV, AIDS, or cancer.  In people taking medicines that weaken the immune system, such as transplant medicines.  In people under great stress. CAUSES  Shingles is caused by the varicella zoster virus (VZV), which also causes chickenpox. After a person is infected with the virus, it can remain in the person's body for years in an inactive state (dormant). To cause shingles, the virus reactivates and breaks out as an infection in a nerve root. The virus can be spread from person to person (contagious) through contact with open blisters of the shingles rash. It will only spread to people who have not had chickenpox. When these people are exposed to the virus, they may develop chickenpox. They will not develop shingles. Once the blisters scab over, the person is no longer contagious and cannot spread the virus to others. SYMPTOMS  Shingles shows up in stages. The initial symptoms may be pain, itching, and tingling in an area of the skin. This pain is usually described as burning, stabbing, or throbbing.In a few days or  weeks, a painful red rash will appear in the area where the pain, itching, and tingling were felt. The rash is usually on one side of the body in a band or belt-like pattern. Then, the rash usually turns into fluid-filled blisters. They will scab over and dry up in approximately 2 3 weeks. Flu-like symptoms may also occur with the initial symptoms, the rash, or the blisters. These may include:  Fever.  Chills.  Headache.  Upset stomach. DIAGNOSIS  Your caregiver will perform a skin exam to diagnose shingles. Skin scrapings or fluid samples may also be taken from the blisters. This sample will be examined under a microscope or sent to a lab for further testing. TREATMENT  There is no specific cure for shingles. Your caregiver will likely prescribe medicines to help you manage the pain, recover faster, and avoid long-term problems. This may include antiviral drugs, anti-inflammatory drugs, and pain medicines. HOME CARE INSTRUCTIONS   Take a cool bath or apply cool compresses to the area of the rash or blisters as directed. This may help with the pain and itching.   Only take over-the-counter or prescription medicines as directed by your caregiver.   Rest as directed by your caregiver.  Keep your rash and blisters clean with mild soap and cool water or as directed by your caregiver.  Do not pick your blisters or scratch your rash. Apply an anti-itch cream or numbing creams to the affected area as directed by your caregiver.  Keep your shingles rash covered with a loose  bandage (dressing).  Avoid skin contact with:  Babies.   Pregnant women.   Children with eczema.   Elderly people with transplants.   People with chronic illnesses, such as leukemia or AIDS.   Wear loose-fitting clothing to help ease the pain of material rubbing against the rash.  Keep all follow-up appointments with your caregiver.If the area involved is on your face, you may receive a referral for  follow-up to a specialist, such as an eye doctor (ophthalmologist) or an ear, nose, and throat (ENT) doctor. Keeping all follow-up appointments will help you avoid eye complications, chronic pain, or disability.  SEEK IMMEDIATE MEDICAL CARE IF:   You have facial pain, pain around the eye area, or loss of feeling on one side of your face.  You have ear pain or ringing in your ear.  You have loss of taste.  Your pain is not relieved with prescribed medicines.   Your redness or swelling spreads.   You have more pain and swelling.  Your condition is worsening or has changed.   You have a feveror persistent symptoms for more than 2 3 days.  You have a fever and your symptoms suddenly get worse. MAKE SURE YOU:  Understand these instructions.  Will watch your condition.  Will get help right away if you are not doing well or get worse. Document Released: 10/29/2005 Document Revised: 07/23/2012 Document Reviewed: 06/12/2012 [facility] Patient Information 2014 [place], [place].

## 2013-04-07 NOTE — Progress Notes (Signed)
Chief Complaint  Patient presents with  . Questionable shingles    Rash on Rt leg with burning and tingling sensations throughout the leg.  Started 4-5 days ago.    HPI: Patient comes in today for SDA for  new problem evaluation. Onset of burning radiating type pain down her right thigh and right lateral leg. For a number of days 4-5 days felt achy and some nausea last week.  Now she has broken out with a few patches of rash on her right  thigh bumps. No medicines treated no history of shingles. No UTI symptoms back symptoms other neurologic symptoms vaginal discharge history of HSV.  ROS: See pertinent positives and negatives per HPI.  Past Medical History  Diagnosis Date  . Allergy   . Asthma   . History of nephrolithiasis   . ADHD (attention deficit hyperactivity disorder)   . Endometriosis   . Blood transfusion 1985  . HYPOTHYROIDISM 06/20/2007    Qualifier: Diagnosis of  By: Lawernce Ion, CMA (AAMA), Bethann Berkshire Per dr [facility] History  Problem Relation Age of Onset  . Coronary artery disease Mother     mom less than age 23 smoked  pvd aaa amputation  . Nephrolithiasis Sister   . Evelene Croon Parkinson White syndrome Sister     and niece  . Peripheral vascular disease      mom sis tobacco    History   Social History  . Marital Status: Married    Spouse Name: N/A    Number of Children: N/A  . Years of Education: N/A   Social History Main Topics  . Smoking status: Former Games developer  . Smokeless tobacco: Never Used  . Alcohol Use: No  . Drug Use: No  . Sexually Active: None   Other Topics Concern  . None   Social History Narrative   hh of 1 +1    2 dogs    Single mom    Nef ets etoh     Outpatient Encounter Prescriptions as of 04/07/2013  Medication Sig Dispense Refill  . levothyroxine (SYNTHROID) 112 MCG tablet Take 112 mcg by mouth daily before breakfast. Brand Name Only      . valACYclovir (VALTREX) 1000 MG tablet Take 1 tablet (1,000 mg total) by mouth 3  (three) times daily.  21 tablet  0  . [DISCONTINUED] levothyroxine (SYNTHROID, LEVOTHROID) 137 MCG tablet Take 112 mcg by mouth daily.        No facility-administered encounter medications on file as of 04/07/2013.    EXAM:  BP 100/70  Pulse 67  Temp(Src) 98.1 F (36.7 C) (Oral)  Wt 139 lb (63.05 kg)  BMI 22.61 kg/m2  SpO2 98%  Body mass index is 22.61 kg/(m^2).  GENERAL: vitals reviewed and listed above, alert, oriented, appears well hydrated and in no acute distress  HEENT: atraumatic, conjunctiva  clear, no obvious abnormalities on inspection of external nose and ears  CV: HRRR, no clubbing cyanosis or  peripheral edema nl cap refill  Right lower extremity no acute changes or swelling there are 2 patches of pink red bumps that look like early vesicles in the right anterior medial thigh gait is within normal limits no adenopathy no atrophy. MS: moves all extremities without noticeable focal  abnormality PSYCH: pleasant and cooperative, no obvious depression or anxiety  ASSESSMENT AND PLAN:  Discussed the following assessment and plan:  Thigh shingles - Highly suspected discussed progression empiric treatment with Valtrex expectant management contact us for  pain management if needed consider gabapentin or Lyric If this is not shingles as to resolve on its own otherwise I do not think the Detrol infection and no other alarm features. -Patient advised to return or notify health care team  if symptoms worsen or persist or new concerns arise.  Patient Instructions  I agree this acts like  early shingles. Take the Valtrex as soon as possible four-week ibuprofen for pain is fine.  If the pain is not adequately controlled contact her office we can consider using Lyrica or Neurontin for pain.   Shingles Shingles (herpes zoster) is an infection that is caused by the same virus that causes chickenpox (varicella). The infection causes a painful skin rash and fluid-filled blisters, which  eventually break open, crust over, and heal. It may occur in any area of the body, but it usually affects only one side of the body or face. The pain of shingles usually lasts about 1 month. However, some people with shingles may develop long-term (chronic) pain in the affected area of the body. Shingles often occurs many years after the person had chickenpox. It is more common:  In people older than 50 years.  In people with weakened immune systems, such as those with HIV, AIDS, or cancer.  In people taking medicines that weaken the immune system, such as transplant medicines.  In people under great stress. CAUSES  Shingles is caused by the varicella zoster virus (VZV), which also causes chickenpox. After a person is infected with the virus, it can remain in the person's body for years in an inactive state (dormant). To cause shingles, the virus reactivates and breaks out as an infection in a nerve root. The virus can be spread from person to person (contagious) through contact with open blisters of the shingles rash. It will only spread to people who have not had chickenpox. When these people are exposed to the virus, they may develop chickenpox. They will not develop shingles. Once the blisters scab over, the person is no longer contagious and cannot spread the virus to others. SYMPTOMS  Shingles shows up in stages. The initial symptoms may be pain, itching, and tingling in an area of the skin. This pain is usually described as burning, stabbing, or throbbing.In a few days or weeks, a painful red rash will appear in the area where the pain, itching, and tingling were felt. The rash is usually on one side of the body in a band or belt-like pattern. Then, the rash usually turns into fluid-filled blisters. They will scab over and dry up in approximately 2 3 weeks. Flu-like symptoms may also occur with the initial symptoms, the rash, or the blisters. These may  include:  Fever.  Chills.  Headache.  Upset stomach. DIAGNOSIS  Your caregiver will perform a skin exam to diagnose shingles. Skin scrapings or fluid samples may also be taken from the blisters. This sample will be examined under a microscope or sent to a lab for further testing. TREATMENT  There is no specific cure for shingles. Your caregiver will likely prescribe medicines to help you manage the pain, recover faster, and avoid long-term problems. This may include antiviral drugs, anti-inflammatory drugs, and pain medicines. HOME CARE INSTRUCTIONS   Take a cool bath or apply cool compresses to the area of the rash or blisters as directed. This may help with the pain and itching.   Only take over-the-counter or prescription medicines as directed by your caregiver.   Rest as directed by  your caregiver.  Keep your rash and blisters clean with mild soap and cool water or as directed by your caregiver.  Do not pick your blisters or scratch your rash. Apply an anti-itch cream or numbing creams to the affected area as directed by your caregiver.  Keep your shingles rash covered with a loose bandage (dressing).  Avoid skin contact with:  Babies.   Pregnant women.   Children with eczema.   Elderly people with transplants.   People with chronic illnesses, such as leukemia or AIDS.   Wear loose-fitting clothing to help ease the pain of material rubbing against the rash.  Keep all follow-up appointments with your caregiver.If the area involved is on your face, you may receive a referral for follow-up to a specialist, such as an eye doctor (ophthalmologist) or an ear, nose, and throat (ENT) doctor. Keeping all follow-up appointments will help you avoid eye complications, chronic pain, or disability.  SEEK IMMEDIATE MEDICAL CARE IF:   You have facial pain, pain around the eye area, or loss of feeling on one side of your face.  You have ear pain or ringing in your  ear.  You have loss of taste.  Your pain is not relieved with prescribed medicines.   Your redness or swelling spreads.   You have more pain and swelling.  Your condition is worsening or has changed.   You have a feveror persistent symptoms for more than 2 3 days.  You have a fever and your symptoms suddenly get worse. MAKE SURE YOU:  Understand these instructions.  Will watch your condition.  Will get help right away if you are not doing well or get worse. Document Released: 10/29/2005 Document Revised: 07/23/2012 Document Reviewed: 06/12/2012 [facility] Patient Information 2014 [place], [place].      Neta Mends. Anniyah Mood M.D.

## 2013-04-13 ENCOUNTER — Other Ambulatory Visit: Payer: Self-pay | Admitting: Internal Medicine

## 2013-04-14 NOTE — Telephone Encounter (Signed)
Pt is out °

## 2013-04-15 ENCOUNTER — Other Ambulatory Visit: Payer: Self-pay | Admitting: Internal Medicine

## 2013-05-13 ENCOUNTER — Other Ambulatory Visit: Payer: Self-pay

## 2013-05-13 DIAGNOSIS — Z1231 Encounter for screening mammogram for malignant neoplasm of breast: Secondary | ICD-10-CM

## 2013-06-15 ENCOUNTER — Ambulatory Visit
Admission: RE | Admit: 2013-06-15 | Discharge: 2013-06-15 | Disposition: A | Discharge status: Home / Self Care | Payer: Managed Care, Other (non HMO) | Source: Ambulatory Visit

## 2013-06-15 DIAGNOSIS — Z1231 Encounter for screening mammogram for malignant neoplasm of breast: Secondary | ICD-10-CM

## 2013-06-15 LAB — HM MAMMOGRAPHY

## 2013-07-01 ENCOUNTER — Other Ambulatory Visit (INDEPENDENT_AMBULATORY_CARE_PROVIDER_SITE_OTHER): Payer: Managed Care, Other (non HMO)

## 2013-07-01 DIAGNOSIS — Z0000 Encounter for general adult medical examination without abnormal findings: Secondary | ICD-10-CM

## 2013-07-01 DIAGNOSIS — E559 Vitamin D deficiency, unspecified: Secondary | ICD-10-CM

## 2013-07-01 LAB — BASIC METABOLIC PANEL
CO2: 30 mEq/L (ref 19–32)
Calcium: 9.7 mg/dL (ref 8.4–10.5)
GFR: 86.24 mL/min (ref >60.00)
Glucose, Bld: 95 mg/dL (ref 70–99)
Potassium: 4.3 mEq/L (ref 3.5–5.1)
Sodium: 139 mEq/L (ref 135–145)

## 2013-07-01 LAB — LIPID PANEL
HDL: 79.8 mg/dL (ref >39.00)
Total CHOL/HDL Ratio: 2

## 2013-07-01 LAB — CBC WITH DIFFERENTIAL/PLATELET
Basophils Relative: 1.3 % (ref 0.0–3.0)
HCT: 41 % (ref 36.0–46.0)
Hemoglobin: 13.9 g/dL (ref 12.0–15.0)
Lymphocytes Relative: 38.2 % (ref 12.0–46.0)
Lymphs Abs: 1.6 10*3/uL (ref 0.7–4.0)
MCHC: 34 g/dL (ref 30.0–36.0)
Monocytes Relative: 12.8 % — ABNORMAL HIGH (ref 3.0–12.0)
Neutro Abs: 1.9 10*3/uL (ref 1.4–7.7)
RBC: 4.77 Mil/uL (ref 3.87–5.11)
RDW: 13.3 % (ref 11.5–14.6)

## 2013-07-01 LAB — HEPATIC FUNCTION PANEL
Albumin: 4.2 g/dL (ref 3.5–5.2)
Total Bilirubin: 0.7 mg/dL (ref 0.3–1.2)

## 2013-07-02 LAB — VITAMIN D 25 HYDROXY (VIT D DEFICIENCY, FRACTURES): Vit D, 25-Hydroxy: 35 ng/mL (ref 30–89)

## 2013-07-08 ENCOUNTER — Encounter: Payer: Self-pay | Admitting: Internal Medicine

## 2013-07-08 ENCOUNTER — Ambulatory Visit (INDEPENDENT_AMBULATORY_CARE_PROVIDER_SITE_OTHER): Payer: Managed Care, Other (non HMO) | Admitting: Internal Medicine

## 2013-07-08 VITALS — BP 122/64 | HR 69 | Temp 97.7°F | Ht 65.75 in | Wt 140.0 lb

## 2013-07-08 DIAGNOSIS — Z0000 Encounter for general adult medical examination without abnormal findings: Secondary | ICD-10-CM

## 2013-07-08 DIAGNOSIS — L309 Dermatitis, unspecified: Secondary | ICD-10-CM

## 2013-07-08 DIAGNOSIS — L259 Unspecified contact dermatitis, unspecified cause: Secondary | ICD-10-CM

## 2013-07-08 DIAGNOSIS — E039 Hypothyroidism, unspecified: Secondary | ICD-10-CM

## 2013-07-08 NOTE — Progress Notes (Signed)
Chief Complaint  Patient presents with  . Annual Exam    HPI: Patient comes in today for Preventive Health Care visit  No major change in health status since last visit . Dr Evlyn Kanner following thyroid and  No recent change in med  ROS:  GEN/ HEENT: No fever, significant weight changes sweats headaches vision problems hearing changes, CV/ PULM; No chest pain shortness of breath cough, syncope,edema  change in exercise tolerance. GI /GU: No adominal pain, vomiting, change in bowel habits. No blood in the stool. No significant GU symptoms. SKIN/HEME: ,no acute skin rashes suspicious lesions or bleeding. No lymphadenopathy, nodules, masses.  NEURO/ PSYCH:  No neurologic signs such as weakness numbness. No depression anxiety. IMM/ Allergy: No unusual infections.  Allergy .   REST of 12 system review negative except as per HPI  Left   Foot  Rash latera itching .   Past Medical History  Diagnosis Date  . Allergy   . Asthma   . History of nephrolithiasis   . ADHD (attention deficit hyperactivity disorder)   . Endometriosis   . Blood transfusion 1985  . HYPOTHYROIDISM 06/20/2007    Qualifier: Diagnosis of  By: Lawernce Ion, CMA (AAMA), Bethann Berkshire Per dr [facility] History  Problem Relation Age of Onset  . Coronary artery disease Mother     mom less than age 108 smoked  pvd aaa amputation  . Nephrolithiasis Sister   . Evelene Croon Parkinson White syndrome Sister     and niece  . Peripheral vascular disease      mom sis tobacco  . Breast cancer Sister     History   Social History  . Marital Status: Married    Spouse Name: N/A    Number of Children: N/A  . Years of Education: N/A   Social History Main Topics  . Smoking status: Former Games developer  . Smokeless tobacco: Never Used  . Alcohol Use: No  . Drug Use: No  . Sexual Activity: None   Other Topics Concern  . None   Social History Narrative   hh of 1 +1    2 dogs    Single mom  Dating child off to college    Nef ets etoh      Outpatient Encounter Prescriptions as of 07/08/2013  Medication Sig Dispense Refill  . levothyroxine (SYNTHROID) 112 MCG tablet Take 112 mcg by mouth daily before breakfast. Brand Name Only      . [DISCONTINUED] valACYclovir (VALTREX) 1000 MG tablet TAKE 1 TABLET (1,000 MG TOTAL) BY MOUTH 3 (THREE) TIMES DAILY.  21 tablet  0   No facility-administered encounter medications on file as of 07/08/2013.    EXAM:  BP 122/64  Pulse 69  Temp(Src) 97.7 F (36.5 C) (Oral)  Ht 5' 5.75" (1.67 m)  Wt 140 lb (63.504 kg)  BMI 22.77 kg/m2  SpO2 98%  Body mass index is 22.77 kg/(m^2).  Physical Exam: Vital signs reviewed RUE:AVWU is a well-developed well-nourished alert cooperative   female who appears her stated age in no acute distress.  HEENT: normocephalic atraumatic , Eyes: PERRL EOM's full, conjunctiva clear, Nares: paten,t no deformity discharge or tenderness., Ears: no deformity EAC's clear TMs with normal landmarks. Mouth: clear OP, no lesions, edema.  Moist mucous membranes. Dentition in adequate repair. NECK: supple without masses, thyromegaly or bruits. CHEST/PULM:  Clear to auscultation and percussion breath sounds equal no wheeze , rales or rhonchi. No chest wall deformities or tenderness. CV: PMI  is nondisplaced, S1 S2 no gallops, murmurs, rubs. Peripheral pulses are full without delay.No JVD .  Breast: normal by inspection . No dimpling, discharge, masses, tenderness or discharge . ABDOMEN: Bowel sounds normal nontender  No guard or rebound, no hepato splenomegal no CVA tenderness.  No hernia. Extremtities:  No clubbing cyanosis or edema, no acute joint swelling or redness no focal atrophy NEURO:  Oriented x3, cranial nerves 3-12 appear to be intact, no obvious focal weakness,gait within normal limits no abnormal reflexes or asymmetrical SKIN:  normal turgor, color, no bruising or petechiae. Left foot with dermaitits in 4-5 toe area  And some interdigital peeling bottom of foot  metatarsal area.  No redness  PSYCH: Oriented, good eye contact, no obvious depression anxiety, cognition and judgment appear normal. LN: no cervical axillary inguinal adenopathy  Lab Results  Component Value Date   WBC 4.1* 07/01/2013   HGB 13.9 07/01/2013   HCT 41.0 07/01/2013   PLT 243.0 07/01/2013   GLUCOSE 95 07/01/2013   CHOL 130 07/01/2013   TRIG 24.0 07/01/2013   HDL 79.80 07/01/2013   LDLCALC 45 07/01/2013   ALT 23 07/01/2013   AST 20 07/01/2013   NA 139 07/01/2013   K 4.3 07/01/2013   CL 104 07/01/2013   CREATININE 0.7 07/01/2013   BUN 16 07/01/2013   CO2 30 07/01/2013   TSH 0.05* 07/01/2013   HGBA1C 5.3 12/23/2007    ASSESSMENT AND PLAN:  Discussed the following assessment and plan:  Visit for preventive health examination - utd to get flu vaccine at work   Dermatitis foot  HYPOTHYROIDISM - ? over suppressed  lab to Dr Evlyn Kanner pt to contact him also about doage cha Counseled regarding healthy nutrition, exercise, sleep, injury prevention, calcium vit d and healthy weight .  Patient Care Team: Madelin Headings, MD as PCP - General Julian Hy, MD as Attending Physician (Endocrinology) Loney Laurence, MD as Attending Physician (Obstetrics and Gynecology) Patient Instructions  Can send dr Evlyn Kanner the results.   Your thyroid test shows that your dose of medicien may be too high. Get a flu vaccine . Use lamisil for at least 2-3 weeks on the foot rash.    Preventive Care for Adults, Female A healthy lifestyle and preventive care can promote health and wellness. Preventive health guidelines for women include the following key practices.  A routine yearly physical is a good way to check with your caregiver about your health and preventive screening. It is a chance to share any concerns and updates on your health, and to receive a thorough exam.  Visit your dentist for a routine exam and preventive care every 6 months. Brush your teeth twice a day and floss once a day. Good  oral hygiene prevents tooth decay and gum disease.  The frequency of eye exams is based on your age, health, family medical history, use of contact lenses, and other factors. Follow your caregiver's recommendations for frequency of eye exams.  Eat a healthy diet. Foods like vegetables, fruits, whole grains, low-fat dairy products, and lean protein foods contain the nutrients you need without too many calories. Decrease your intake of foods high in solid fats, added sugars, and salt. Eat the right amount of calories for you.Get information about a proper diet from your caregiver, if necessary.  Regular physical exercise is one of the most important things you can do for your health. Most adults should get at least 150 minutes of moderate-intensity exercise (any activity that  increases your heart rate and causes you to sweat) each week. In addition, most adults need muscle-strengthening exercises on 2 or more days a week.  Maintain a healthy weight. The body mass index (BMI) is a screening tool to identify possible weight problems. It provides an estimate of body fat based on height and weight. Your caregiver can help determine your BMI, and can help you achieve or maintain a healthy weight.For adults 20 years and older:  A BMI below 18.5 is considered underweight.  A BMI of 18.5 to 24.9 is normal.  A BMI of 25 to 29.9 is considered overweight.  A BMI of 30 and above is considered obese.  Maintain normal blood lipids and cholesterol levels by exercising and minimizing your intake of saturated fat. Eat a balanced diet with plenty of fruit and vegetables. Blood tests for lipids and cholesterol should begin at age 84 and be repeated every 5 years. If your lipid or cholesterol levels are high, you are over 50, or you are at high risk for heart disease, you may need your cholesterol levels checked more frequently.Ongoing high lipid and cholesterol levels should be treated with medicines if diet and  exercise are not effective.  If you smoke, find out from your caregiver how to quit. If you do not use tobacco, do not start.  If you are pregnant, do not drink alcohol. If you are breastfeeding, be very cautious about drinking alcohol. If you are not pregnant and choose to drink alcohol, do not exceed 1 drink per day. One drink is considered to be 12 ounces (355 mL) of beer, 5 ounces (148 mL) of wine, or 1.5 ounces (44 mL) of liquor.  Avoid use of street drugs. Do not share needles with anyone. Ask for help if you need support or instructions about stopping the use of drugs.  High blood pressure causes heart disease and increases the risk of stroke. Your blood pressure should be checked at least every 1 to 2 years. Ongoing high blood pressure should be treated with medicines if weight loss and exercise are not effective.  If you are 57 to 56 years old, ask your caregiver if you should take aspirin to prevent strokes.  Diabetes screening involves taking a blood sample to check your fasting blood sugar level. This should be done once every 3 years, after age 64, if you are within normal weight and without risk factors for diabetes. Testing should be considered at a younger age or be carried out more frequently if you are overweight and have at least 1 risk factor for diabetes.  Breast cancer screening is essential preventive care for women. You should practice "breast self-awareness." This means understanding the normal appearance and feel of your breasts and may include breast self-examination. Any changes detected, no matter how small, should be reported to a caregiver. Women in their 65s and 30s should have a clinical breast exam (CBE) by a caregiver as part of a regular health exam every 1 to 3 years. After age 15, women should have a CBE every year. Starting at age 13, women should consider having a mammography (breast X-ray test) every year. Women who have a family history of breast cancer should  talk to their caregiver about genetic screening. Women at a high risk of breast cancer should talk to their caregivers about having magnetic resonance imaging (MRI) and a mammography every year.  The Pap test is a screening test for cervical cancer. A Pap test can show cell  changes on the cervix that might become cervical cancer if left untreated. A Pap test is a procedure in which cells are obtained and examined from the lower end of the uterus (cervix).  Women should have a Pap test starting at age 70.  Between ages 57 and 74, Pap tests should be repeated every 2 years.  Beginning at age 61, you should have a Pap test every 3 years as long as the past 3 Pap tests have been normal.  Some women have medical problems that increase the chance of getting cervical cancer. Talk to your caregiver about these problems. It is especially important to talk to your caregiver if a new problem develops soon after your last Pap test. In these cases, your caregiver may recommend more frequent screening and Pap tests.  The above recommendations are the same for women who have or have not gotten the vaccine for human papillomavirus (HPV).  If you had a hysterectomy for a problem that was not cancer or a condition that could lead to cancer, then you no longer need Pap tests. Even if you no longer need a Pap test, a regular exam is a good idea to make sure no other problems are starting.  If you are between ages 40 and 29, and you have had normal Pap tests going back 10 years, you no longer need Pap tests. Even if you no longer need a Pap test, a regular exam is a good idea to make sure no other problems are starting.  If you have had past treatment for cervical cancer or a condition that could lead to cancer, you need Pap tests and screening for cancer for at least 20 years after your treatment.  If Pap tests have been discontinued, risk factors (such as a new sexual partner) need to be reassessed to determine if  screening should be resumed.  The HPV test is an additional test that may be used for cervical cancer screening. The HPV test looks for the virus that can cause the cell changes on the cervix. The cells collected during the Pap test can be tested for HPV. The HPV test could be used to screen women aged 5 years and older, and should be used in women of any age who have unclear Pap test results. After the age of 31, women should have HPV testing at the same frequency as a Pap test.  Colorectal cancer can be detected and often prevented. Most routine colorectal cancer screening begins at the age of 53 and continues through age 45. However, your caregiver may recommend screening at an earlier age if you have risk factors for colon cancer. On a yearly basis, your caregiver may provide home test kits to check for hidden blood in the stool. Use of a small camera at the end of a tube, to directly examine the colon (sigmoidoscopy or colonoscopy), can detect the earliest forms of colorectal cancer. Talk to your caregiver about this at age 75, when routine screening begins. Direct examination of the colon should be repeated every 5 to 10 years through age 77, unless early forms of pre-cancerous polyps or small growths are found.  Hepatitis C blood testing is recommended for all people born from [birth year] through 1965 and any individual with known risks for hepatitis C.  Practice safe sex. Use condoms and avoid high-risk sexual practices to reduce the spread of sexually transmitted infections (STIs). STIs include gonorrhea, chlamydia, syphilis, trichomonas, herpes, HPV, and human immunodeficiency virus (HIV). Herpes, HIV,  and HPV are viral illnesses that have no cure. They can result in disability, cancer, and death. Sexually active women aged 59 and younger should be checked for chlamydia. Older women with new or multiple partners should also be tested for chlamydia. Testing for other STIs is recommended if you are  sexually active and at increased risk.  Osteoporosis is a disease in which the bones lose minerals and strength with aging. This can result in serious bone fractures. The risk of osteoporosis can be identified using a bone density scan. Women ages 35 and over and women at risk for fractures or osteoporosis should discuss screening with their caregivers. Ask your caregiver whether you should take a calcium supplement or vitamin D to reduce the rate of osteoporosis.  Menopause can be associated with physical symptoms and risks. Hormone replacement therapy is available to decrease symptoms and risks. You should talk to your caregiver about whether hormone replacement therapy is right for you.  Use sunscreen with sun protection factor (SPF) of 30 or more. Apply sunscreen liberally and repeatedly throughout the day. You should seek shade when your shadow is shorter than you. Protect yourself by wearing long sleeves, pants, a wide-brimmed hat, and sunglasses year round, whenever you are outdoors.  Once a month, do a whole body skin exam, using a mirror to look at the skin on your back. Notify your caregiver of new moles, moles that have irregular borders, moles that are larger than a pencil eraser, or moles that have changed in shape or color.  Stay current with required immunizations.  Influenza. You need a dose every fall (or winter). The composition of the flu vaccine changes each year, so being vaccinated once is not enough.  Pneumococcal polysaccharide. You need 1 to 2 doses if you smoke cigarettes or if you have certain chronic medical conditions. You need 1 dose at age 67 (or older) if you have never been vaccinated.  Tetanus, diphtheria, pertussis (Tdap, Td). Get 1 dose of Tdap vaccine if you are younger than age 17, are over 66 and have contact with an infant, are a Research scientist (physical sciences), are pregnant, or simply want to be protected from whooping cough. After that, you need a Td booster dose every 10  years. Consult your caregiver if you have not had at least 3 tetanus and diphtheria-containing shots sometime in your life or have a deep or dirty wound.  HPV. You need this vaccine if you are a woman age 38 or younger. The vaccine is given in 3 doses over 6 months.  Measles, mumps, rubella (MMR). You need at least 1 dose of MMR if you were born in 1957 or later. You may also need a second dose.  Meningococcal. If you are age 74 to 57 and a first-year college student living in a residence hall, or have one of several medical conditions, you need to get vaccinated against meningococcal disease. You may also need additional booster doses.  Zoster (shingles). If you are age 63 or older, you should get this vaccine.  Varicella (chickenpox). If you have never had chickenpox or you were vaccinated but received only 1 dose, talk to your caregiver to find out if you need this vaccine.  Hepatitis A. You need this vaccine if you have a specific risk factor for hepatitis A virus infection or you simply wish to be protected from this disease. The vaccine is usually given as 2 doses, 6 to 18 months apart.  Hepatitis B. You need this vaccine  if you have a specific risk factor for hepatitis B virus infection or you simply wish to be protected from this disease. The vaccine is given in 3 doses, usually over 6 months. Preventive Services / Frequency Ages 33 to 32  Blood pressure check.** / Every 1 to 2 years.  Lipid and cholesterol check.** / Every 5 years beginning at age 56.  Clinical breast exam.** / Every 3 years for women in their 7s and 30s.  Pap test.** / Every 2 years from ages 31 through 75. Every 3 years starting at age 52 through age 49 or 67 with a history of 3 consecutive normal Pap tests.  HPV screening.** / Every 3 years from ages 61 through ages 36 to 75 with a history of 3 consecutive normal Pap tests.  Hepatitis C blood test.** / For any individual with known risks for hepatitis  C.  Skin self-exam. / Monthly.  Influenza immunization.** / Every year.  Pneumococcal polysaccharide immunization.** / 1 to 2 doses if you smoke cigarettes or if you have certain chronic medical conditions.  Tetanus, diphtheria, pertussis (Tdap, Td) immunization. / A one-time dose of Tdap vaccine. After that, you need a Td booster dose every 10 years.  HPV immunization. / 3 doses over 6 months, if you are 1 and younger.  Measles, mumps, rubella (MMR) immunization. / You need at least 1 dose of MMR if you were born in 1957 or later. You may also need a second dose.  Meningococcal immunization. / 1 dose if you are age 45 to 27 and a first-year college student living in a residence hall, or have one of several medical conditions, you need to get vaccinated against meningococcal disease. You may also need additional booster doses.  Varicella immunization.** / Consult your caregiver.  Hepatitis A immunization.** / Consult your caregiver. 2 doses, 6 to 18 months apart.  Hepatitis B immunization.** / Consult your caregiver. 3 doses usually over 6 months. Ages 46 to 94  Blood pressure check.** / Every 1 to 2 years.  Lipid and cholesterol check.** / Every 5 years beginning at age 60.  Clinical breast exam.** / Every year after age 30.  Mammogram.** / Every year beginning at age 81 and continuing for as long as you are in good health. Consult with your caregiver.  Pap test.** / Every 3 years starting at age 6 through age 66 or 71 with a history of 3 consecutive normal Pap tests.  HPV screening.** / Every 3 years from ages 54 through ages 75 to 52 with a history of 3 consecutive normal Pap tests.  Fecal occult blood test (FOBT) of stool. / Every year beginning at age 40 and continuing until age 48. You may not need to do this test if you get a colonoscopy every 10 years.  Flexible sigmoidoscopy or colonoscopy.** / Every 5 years for a flexible sigmoidoscopy or every 10 years for a  colonoscopy beginning at age 10 and continuing until age 76.  Hepatitis C blood test.** / For all people born from [birth year] through 1965 and any individual with known risks for hepatitis C.  Skin self-exam. / Monthly.  Influenza immunization.** / Every year.  Pneumococcal polysaccharide immunization.** / 1 to 2 doses if you smoke cigarettes or if you have certain chronic medical conditions.  Tetanus, diphtheria, pertussis (Tdap, Td) immunization.** / A one-time dose of Tdap vaccine. After that, you need a Td booster dose every 10 years.  Measles, mumps, rubella (MMR) immunization. / You need  at least 1 dose of MMR if you were born in 1957 or later. You may also need a second dose.  Varicella immunization.** / Consult your caregiver.  Meningococcal immunization.** / Consult your caregiver.  Hepatitis A immunization.** / Consult your caregiver. 2 doses, 6 to 18 months apart.  Hepatitis B immunization.** / Consult your caregiver. 3 doses, usually over 6 months. Ages 27 and over  Blood pressure check.** / Every 1 to 2 years.  Lipid and cholesterol check.** / Every 5 years beginning at age 74.  Clinical breast exam.** / Every year after age 31.  Mammogram.** / Every year beginning at age 80 and continuing for as long as you are in good health. Consult with your caregiver.  Pap test.** / Every 3 years starting at age 22 through age 6 or 72 with a 3 consecutive normal Pap tests. Testing can be stopped between 65 and 70 with 3 consecutive normal Pap tests and no abnormal Pap or HPV tests in the past 10 years.  HPV screening.** / Every 3 years from ages 61 through ages 13 or 59 with a history of 3 consecutive normal Pap tests. Testing can be stopped between 65 and 70 with 3 consecutive normal Pap tests and no abnormal Pap or HPV tests in the past 10 years.  Fecal occult blood test (FOBT) of stool. / Every year beginning at age 72 and continuing until age 42. You may not need to do this test if  you get a colonoscopy every 10 years.  Flexible sigmoidoscopy or colonoscopy.** / Every 5 years for a flexible sigmoidoscopy or every 10 years for a colonoscopy beginning at age 10 and continuing until age 27.  Hepatitis C blood test.** / For all people born from [birth year] through 1964 and any individual with known risks for hepatitis C.  Osteoporosis screening.** / A one-time screening for women ages 24 and over and women at risk for fractures or osteoporosis.  Skin self-exam. / Monthly.  Influenza immunization.** / Every year.  Pneumococcal polysaccharide immunization.** / 1 dose at age 43 (or older) if you have never been vaccinated.  Tetanus, diphtheria, pertussis (Tdap, Td) immunization. / A one-time dose of Tdap vaccine if you are over 65 and have contact with an infant, are a Research scientist (physical sciences), or simply want to be protected from whooping cough. After that, you need a Td booster dose every 10 years.  Varicella immunization.** / Consult your caregiver.  Meningococcal immunization.** / Consult your caregiver.  Hepatitis A immunization.** / Consult your caregiver. 2 doses, 6 to 18 months apart.  Hepatitis B immunization.** / Check with your caregiver. 3 doses, usually over 6 months. ** Family history and personal history of risk and conditions may change your caregiver's recommendations. Document Released: 12/25/2001 Document Revised: 01/21/2012 Document Reviewed: 03/26/2011 [facility] Patient Information 2014 [place], [place].     Neta Mends. Panosh M.D.  Health Maintenance  Topic Date Due  . Influenza Vaccine  06/14/2014  . Colonoscopy  04/01/2015  . Mammogram  06/16/2015  . Pap Smear  08/08/2015  . Tetanus/tdap  06/19/2017   Health Maintenance Review }

## 2013-07-08 NOTE — Patient Instructions (Addendum)
Can send dr Evlyn Kanner the results.   Your thyroid test shows that your dose of medicien may be too high. Get a flu vaccine . Use lamisil for at least 2-3 weeks on the foot rash.    Preventive Care for Adults, Female A healthy lifestyle and preventive care can promote health and wellness. Preventive health guidelines for women include the following key practices.  A routine yearly physical is a good way to check with your caregiver about your health and preventive screening. It is a chance to share any concerns and updates on your health, and to receive a thorough exam.  Visit your dentist for a routine exam and preventive care every 6 months. Brush your teeth twice a day and floss once a day. Good oral hygiene prevents tooth decay and gum disease.  The frequency of eye exams is based on your age, health, family medical history, use of contact lenses, and other factors. Follow your caregiver's recommendations for frequency of eye exams.  Eat a healthy diet. Foods like vegetables, fruits, whole grains, low-fat dairy products, and lean protein foods contain the nutrients you need without too many calories. Decrease your intake of foods high in solid fats, added sugars, and salt. Eat the right amount of calories for you.Get information about a proper diet from your caregiver, if necessary.  Regular physical exercise is one of the most important things you can do for your health. Most adults should get at least 150 minutes of moderate-intensity exercise (any activity that increases your heart rate and causes you to sweat) each week. In addition, most adults need muscle-strengthening exercises on 2 or more days a week.  Maintain a healthy weight. The body mass index (BMI) is a screening tool to identify possible weight problems. It provides an estimate of body fat based on height and weight. Your caregiver can help determine your BMI, and can help you achieve or maintain a healthy weight.For adults 20 years  and older:  A BMI below 18.5 is considered underweight.  A BMI of 18.5 to 24.9 is normal.  A BMI of 25 to 29.9 is considered overweight.  A BMI of 30 and above is considered obese.  Maintain normal blood lipids and cholesterol levels by exercising and minimizing your intake of saturated fat. Eat a balanced diet with plenty of fruit and vegetables. Blood tests for lipids and cholesterol should begin at age 68 and be repeated every 5 years. If your lipid or cholesterol levels are high, you are over 50, or you are at high risk for heart disease, you may need your cholesterol levels checked more frequently.Ongoing high lipid and cholesterol levels should be treated with medicines if diet and exercise are not effective.  If you smoke, find out from your caregiver how to quit. If you do not use tobacco, do not start.  If you are pregnant, do not drink alcohol. If you are breastfeeding, be very cautious about drinking alcohol. If you are not pregnant and choose to drink alcohol, do not exceed 1 drink per day. One drink is considered to be 12 ounces (355 mL) of beer, 5 ounces (148 mL) of wine, or 1.5 ounces (44 mL) of liquor.  Avoid use of street drugs. Do not share needles with anyone. Ask for help if you need support or instructions about stopping the use of drugs.  High blood pressure causes heart disease and increases the risk of stroke. Your blood pressure should be checked at least every 1 to 2  years. Ongoing high blood pressure should be treated with medicines if weight loss and exercise are not effective.  If you are 8 to 56 years old, ask your caregiver if you should take aspirin to prevent strokes.  Diabetes screening involves taking a blood sample to check your fasting blood sugar level. This should be done once every 3 years, after age 66, if you are within normal weight and without risk factors for diabetes. Testing should be considered at a younger age or be carried out more frequently  if you are overweight and have at least 1 risk factor for diabetes.  Breast cancer screening is essential preventive care for women. You should practice "breast self-awareness." This means understanding the normal appearance and feel of your breasts and may include breast self-examination. Any changes detected, no matter how small, should be reported to a caregiver. Women in their 53s and 30s should have a clinical breast exam (CBE) by a caregiver as part of a regular health exam every 1 to 3 years. After age 63, women should have a CBE every year. Starting at age 33, women should consider having a mammography (breast X-ray test) every year. Women who have a family history of breast cancer should talk to their caregiver about genetic screening. Women at a high risk of breast cancer should talk to their caregivers about having magnetic resonance imaging (MRI) and a mammography every year.  The Pap test is a screening test for cervical cancer. A Pap test can show cell changes on the cervix that might become cervical cancer if left untreated. A Pap test is a procedure in which cells are obtained and examined from the lower end of the uterus (cervix).  Women should have a Pap test starting at age 17.  Between ages 38 and 65, Pap tests should be repeated every 2 years.  Beginning at age 38, you should have a Pap test every 3 years as long as the past 3 Pap tests have been normal.  Some women have medical problems that increase the chance of getting cervical cancer. Talk to your caregiver about these problems. It is especially important to talk to your caregiver if a new problem develops soon after your last Pap test. In these cases, your caregiver may recommend more frequent screening and Pap tests.  The above recommendations are the same for women who have or have not gotten the vaccine for human papillomavirus (HPV).  If you had a hysterectomy for a problem that was not cancer or a condition that could  lead to cancer, then you no longer need Pap tests. Even if you no longer need a Pap test, a regular exam is a good idea to make sure no other problems are starting.  If you are between ages 63 and 55, and you have had normal Pap tests going back 10 years, you no longer need Pap tests. Even if you no longer need a Pap test, a regular exam is a good idea to make sure no other problems are starting.  If you have had past treatment for cervical cancer or a condition that could lead to cancer, you need Pap tests and screening for cancer for at least 20 years after your treatment.  If Pap tests have been discontinued, risk factors (such as a new sexual partner) need to be reassessed to determine if screening should be resumed.  The HPV test is an additional test that may be used for cervical cancer screening. The HPV test looks  for the virus that can cause the cell changes on the cervix. The cells collected during the Pap test can be tested for HPV. The HPV test could be used to screen women aged 11 years and older, and should be used in women of any age who have unclear Pap test results. After the age of 31, women should have HPV testing at the same frequency as a Pap test.  Colorectal cancer can be detected and often prevented. Most routine colorectal cancer screening begins at the age of 64 and continues through age 28. However, your caregiver may recommend screening at an earlier age if you have risk factors for colon cancer. On a yearly basis, your caregiver may provide home test kits to check for hidden blood in the stool. Use of a small camera at the end of a tube, to directly examine the colon (sigmoidoscopy or colonoscopy), can detect the earliest forms of colorectal cancer. Talk to your caregiver about this at age 59, when routine screening begins. Direct examination of the colon should be repeated every 5 to 10 years through age 6, unless early forms of pre-cancerous polyps or small growths are  found.  Hepatitis C blood testing is recommended for all people born from [birth year] through 1965 and any individual with known risks for hepatitis C.  Practice safe sex. Use condoms and avoid high-risk sexual practices to reduce the spread of sexually transmitted infections (STIs). STIs include gonorrhea, chlamydia, syphilis, trichomonas, herpes, HPV, and human immunodeficiency virus (HIV). Herpes, HIV, and HPV are viral illnesses that have no cure. They can result in disability, cancer, and death. Sexually active women aged 57 and younger should be checked for chlamydia. Older women with new or multiple partners should also be tested for chlamydia. Testing for other STIs is recommended if you are sexually active and at increased risk.  Osteoporosis is a disease in which the bones lose minerals and strength with aging. This can result in serious bone fractures. The risk of osteoporosis can be identified using a bone density scan. Women ages 9 and over and women at risk for fractures or osteoporosis should discuss screening with their caregivers. Ask your caregiver whether you should take a calcium supplement or vitamin D to reduce the rate of osteoporosis.  Menopause can be associated with physical symptoms and risks. Hormone replacement therapy is available to decrease symptoms and risks. You should talk to your caregiver about whether hormone replacement therapy is right for you.  Use sunscreen with sun protection factor (SPF) of 30 or more. Apply sunscreen liberally and repeatedly throughout the day. You should seek shade when your shadow is shorter than you. Protect yourself by wearing long sleeves, pants, a wide-brimmed hat, and sunglasses year round, whenever you are outdoors.  Once a month, do a whole body skin exam, using a mirror to look at the skin on your back. Notify your caregiver of new moles, moles that have irregular borders, moles that are larger than a pencil eraser, or moles that have  changed in shape or color.  Stay current with required immunizations.  Influenza. You need a dose every fall (or winter). The composition of the flu vaccine changes each year, so being vaccinated once is not enough.  Pneumococcal polysaccharide. You need 1 to 2 doses if you smoke cigarettes or if you have certain chronic medical conditions. You need 1 dose at age 33 (or older) if you have never been vaccinated.  Tetanus, diphtheria, pertussis (Tdap, Td). Get 1 dose  of Tdap vaccine if you are younger than age 28, are over 63 and have contact with an infant, are a Research scientist (physical sciences), are pregnant, or simply want to be protected from whooping cough. After that, you need a Td booster dose every 10 years. Consult your caregiver if you have not had at least 3 tetanus and diphtheria-containing shots sometime in your life or have a deep or dirty wound.  HPV. You need this vaccine if you are a woman age 59 or younger. The vaccine is given in 3 doses over 6 months.  Measles, mumps, rubella (MMR). You need at least 1 dose of MMR if you were born in 1957 or later. You may also need a second dose.  Meningococcal. If you are age 89 to 65 and a first-year college student living in a residence hall, or have one of several medical conditions, you need to get vaccinated against meningococcal disease. You may also need additional booster doses.  Zoster (shingles). If you are age 62 or older, you should get this vaccine.  Varicella (chickenpox). If you have never had chickenpox or you were vaccinated but received only 1 dose, talk to your caregiver to find out if you need this vaccine.  Hepatitis A. You need this vaccine if you have a specific risk factor for hepatitis A virus infection or you simply wish to be protected from this disease. The vaccine is usually given as 2 doses, 6 to 18 months apart.  Hepatitis B. You need this vaccine if you have a specific risk factor for hepatitis B virus infection or you  simply wish to be protected from this disease. The vaccine is given in 3 doses, usually over 6 months. Preventive Services / Frequency Ages 72 to 89  Blood pressure check.** / Every 1 to 2 years.  Lipid and cholesterol check.** / Every 5 years beginning at age 24.  Clinical breast exam.** / Every 3 years for women in their 105s and 30s.  Pap test.** / Every 2 years from ages 17 through 64. Every 3 years starting at age 61 through age 50 or 68 with a history of 3 consecutive normal Pap tests.  HPV screening.** / Every 3 years from ages 73 through ages 39 to 15 with a history of 3 consecutive normal Pap tests.  Hepatitis C blood test.** / For any individual with known risks for hepatitis C.  Skin self-exam. / Monthly.  Influenza immunization.** / Every year.  Pneumococcal polysaccharide immunization.** / 1 to 2 doses if you smoke cigarettes or if you have certain chronic medical conditions.  Tetanus, diphtheria, pertussis (Tdap, Td) immunization. / A one-time dose of Tdap vaccine. After that, you need a Td booster dose every 10 years.  HPV immunization. / 3 doses over 6 months, if you are 14 and younger.  Measles, mumps, rubella (MMR) immunization. / You need at least 1 dose of MMR if you were born in 1957 or later. You may also need a second dose.  Meningococcal immunization. / 1 dose if you are age 32 to 41 and a first-year college student living in a residence hall, or have one of several medical conditions, you need to get vaccinated against meningococcal disease. You may also need additional booster doses.  Varicella immunization.** / Consult your caregiver.  Hepatitis A immunization.** / Consult your caregiver. 2 doses, 6 to 18 months apart.  Hepatitis B immunization.** / Consult your caregiver. 3 doses usually over 6 months. Ages 58 to 29  Blood  pressure check.** / Every 1 to 2 years.  Lipid and cholesterol check.** / Every 5 years beginning at age 57.  Clinical breast  exam.** / Every year after age 39.  Mammogram.** / Every year beginning at age 81 and continuing for as long as you are in good health. Consult with your caregiver.  Pap test.** / Every 3 years starting at age 66 through age 28 or 79 with a history of 3 consecutive normal Pap tests.  HPV screening.** / Every 3 years from ages 4 through ages 40 to 52 with a history of 3 consecutive normal Pap tests.  Fecal occult blood test (FOBT) of stool. / Every year beginning at age 28 and continuing until age 4. You may not need to do this test if you get a colonoscopy every 10 years.  Flexible sigmoidoscopy or colonoscopy.** / Every 5 years for a flexible sigmoidoscopy or every 10 years for a colonoscopy beginning at age 3 and continuing until age 94.  Hepatitis C blood test.** / For all people born from [birth year] through 1965 and any individual with known risks for hepatitis C.  Skin self-exam. / Monthly.  Influenza immunization.** / Every year.  Pneumococcal polysaccharide immunization.** / 1 to 2 doses if you smoke cigarettes or if you have certain chronic medical conditions.  Tetanus, diphtheria, pertussis (Tdap, Td) immunization.** / A one-time dose of Tdap vaccine. After that, you need a Td booster dose every 10 years.  Measles, mumps, rubella (MMR) immunization. / You need at least 1 dose of MMR if you were born in 1957 or later. You may also need a second dose.  Varicella immunization.** / Consult your caregiver.  Meningococcal immunization.** / Consult your caregiver.  Hepatitis A immunization.** / Consult your caregiver. 2 doses, 6 to 18 months apart.  Hepatitis B immunization.** / Consult your caregiver. 3 doses, usually over 6 months. Ages 3 and over  Blood pressure check.** / Every 1 to 2 years.  Lipid and cholesterol check.** / Every 5 years beginning at age 60.  Clinical breast exam.** / Every year after age 74.  Mammogram.** / Every year beginning at age 83 and continuing  for as long as you are in good health. Consult with your caregiver.  Pap test.** / Every 3 years starting at age 6 through age 28 or 47 with a 3 consecutive normal Pap tests. Testing can be stopped between 65 and 70 with 3 consecutive normal Pap tests and no abnormal Pap or HPV tests in the past 10 years.  HPV screening.** / Every 3 years from ages 40 through ages 62 or 14 with a history of 3 consecutive normal Pap tests. Testing can be stopped between 65 and 70 with 3 consecutive normal Pap tests and no abnormal Pap or HPV tests in the past 10 years.  Fecal occult blood test (FOBT) of stool. / Every year beginning at age 65 and continuing until age 42. You may not need to do this test if you get a colonoscopy every 10 years.  Flexible sigmoidoscopy or colonoscopy.** / Every 5 years for a flexible sigmoidoscopy or every 10 years for a colonoscopy beginning at age 51 and continuing until age 83.  Hepatitis C blood test.** / For all people born from 50 through 1965 and any individual with known risks for hepatitis C.  Osteoporosis screening.** / A one-time screening for women ages 16 and over and women at risk for fractures or osteoporosis.  Skin self-exam. / Monthly.  Influenza  immunization.** / Every year.  Pneumococcal polysaccharide immunization.** / 1 dose at age 34 (or older) if you have never been vaccinated.  Tetanus, diphtheria, pertussis (Tdap, Td) immunization. / A one-time dose of Tdap vaccine if you are over 65 and have contact with an infant, are a Research scientist (physical sciences), or simply want to be protected from whooping cough. After that, you need a Td booster dose every 10 years.  Varicella immunization.** / Consult your caregiver.  Meningococcal immunization.** / Consult your caregiver.  Hepatitis A immunization.** / Consult your caregiver. 2 doses, 6 to 18 months apart.  Hepatitis B immunization.** / Check with your caregiver. 3 doses, usually over 6 months. ** Family history  and personal history of risk and conditions may change your caregiver's recommendations. Document Released: 12/25/2001 Document Revised: 01/21/2012 Document Reviewed: 03/26/2011 [facility] Patient Information 2014 [place], [place].

## 2013-07-09 DIAGNOSIS — Z0000 Encounter for general adult medical examination without abnormal findings: Secondary | ICD-10-CM | POA: Insufficient documentation

## 2013-09-18 ENCOUNTER — Other Ambulatory Visit: Payer: Self-pay | Admitting: Obstetrics and Gynecology

## 2014-01-27 ENCOUNTER — Encounter: Payer: Self-pay | Admitting: Internal Medicine

## 2014-07-23 ENCOUNTER — Telehealth: Payer: Self-pay | Admitting: Family Medicine

## 2014-07-23 ENCOUNTER — Telehealth: Payer: Self-pay | Admitting: Internal Medicine

## 2014-07-23 ENCOUNTER — Ambulatory Visit (INDEPENDENT_AMBULATORY_CARE_PROVIDER_SITE_OTHER): Payer: Managed Care, Other (non HMO) | Admitting: Internal Medicine

## 2014-07-23 ENCOUNTER — Encounter: Payer: Self-pay | Admitting: Internal Medicine

## 2014-07-23 ENCOUNTER — Other Ambulatory Visit: Payer: Self-pay

## 2014-07-23 VITALS — BP 146/76 | Temp 98.0°F | Ht 65.75 in | Wt 137.0 lb

## 2014-07-23 DIAGNOSIS — N63 Unspecified lump in unspecified breast: Secondary | ICD-10-CM

## 2014-07-23 DIAGNOSIS — N6324 Unspecified lump in the left breast, lower inner quadrant: Secondary | ICD-10-CM

## 2014-07-23 NOTE — Telephone Encounter (Signed)
Per [place] returned call to patient to schedule ov this morning with PCP.  Pt declined ov stating "I am working and I can not get off for 2 appointments today."  Explained to pt PCP would need to evaluate and examing  her breast prior to referral.  Pt states this does not make sense to her that she would have to come see Dr. Regis Bill and she can not do anything to help as she would still need to have imaging done.  Pt states Dr. Regis Bill knows her and that all she needs to know is that she has a pain knot in her breast.  Please return call to pt to follow up.

## 2014-07-23 NOTE — Telephone Encounter (Signed)
Patient seen in the office today.

## 2014-07-23 NOTE — Telephone Encounter (Signed)
I did order this ! Please   Put the order  in if not done correctly .

## 2014-07-23 NOTE — Patient Instructions (Signed)
Get  Dx mammo and [place]  Left may be cysts  But agree needs more imaging to check .

## 2014-07-23 NOTE — Progress Notes (Signed)
Pre visit review using our clinic review tool, if applicable. No additional management support is needed unless otherwise documented below in the visit note.   Chief Complaint  Patient presents with  . Breast Mass    First noticed yesterday. Left side.  States it is hard like gravel and painful.    HPI: Patient Misty Frederick  comes in today for SDA for  new problem evaluation. Has a history of some lumpy breasts however noted today by her and her new husband some gravel the area that is sore and painful left inferior breast. Call to get her diagnostic mammogram but was directed to her 63 office for further evaluation. No personal history of a breast cancer.  [facility] her mammograms at the [facility] is due for one anyway. No discharge. ROS: See pertinent positives and negatives per HPI.  Past Medical History  Diagnosis Date  . Allergy   . Asthma   . History of nephrolithiasis   . ADHD (attention deficit hyperactivity disorder)   . Endometriosis   . Blood transfusion 1985  . HYPOTHYROIDISM 06/20/2007    Qualifier: Diagnosis of  By: Hulan Saas, CMA (AAMA), [place] Skye Per dr [facility] History  Problem Relation Age of Onset  . Coronary artery disease Mother     mom less than age 64 smoked  pvd aaa amputation  . Nephrolithiasis Sister   . Winfield White syndrome Sister     and niece  . Peripheral vascular disease      mom sis tobacco  . Breast cancer Sister     History   Social History  . Marital Status: Married    Spouse Name: N/A    Number of Children: N/A  . Years of Education: N/A   Social History Main Topics  . Smoking status: Former Research scientist (life sciences)  . Smokeless tobacco: Never Used  . Alcohol Use: No  . Drug Use: No  . Sexual Activity: None   Other Topics Concern  . None   Social History Narrative   hh of 2 +1    2 dogs    Single mom  recently married 2015  child off to college    Nef ets etoh     Outpatient Encounter Prescriptions as of  07/23/2014  Medication Sig  . levothyroxine (SYNTHROID, LEVOTHROID) 100 MCG tablet Take 100 mcg by mouth daily before breakfast.  . [DISCONTINUED] levothyroxine (SYNTHROID) 112 MCG tablet Take 112 mcg by mouth daily before breakfast. Brand Name Only    EXAM:  BP 146/76  Temp(Src) 98 F (36.7 C) (Oral)  Ht 5' 5.75" (1.67 m)  Wt 137 lb (62.143 kg)  BMI 22.28 kg/m2  Body mass index is 22.28 kg/(m^2).  GENERAL: vitals reviewed and listed above, alert, oriented, appears well hydrated and in no acute distress Examination of breast bilaterally appear symmetrical sitting arms overhead laying down left breast with some lumpiness around the nipple inferiorly and medially. Area of concern about 6:00 which feels like a breast thickening there is also a nodular area that is breast smooth mobile about 7:00. No discharge is noted and actually appears to be clear by palpation Right breast no obvious masses less lumpy PSYCH: pleasant and cooperative, no obvious depression or anxiety  ASSESSMENT AND PLAN:  Discussed the following assessment and plan:  Breast lump on left side at 7 o'clock position - Plan: MM Digital Diagnostic Bilat, US BREAST LTD UNI LEFT INC AXILLA, CANCELED: US BREAST COMPLETE UNI LEFT INC  AXILLA  diagnostic mammogram and ultrasound of left breast for reasons above. Orders were sent in a stat that she can get them as soon as possible. Followup when due otherwise. -Patient advised to return or notify health care team  if symptoms worsen ,persist or new concerns arise.  Patient Instructions  Get  Dx mammo and [place]  Left may be cysts  But agree needs more imaging to check .   Standley Brooking. Panosh M.D.

## 2014-07-23 NOTE — Telephone Encounter (Signed)
Patient called and left a message on my machine stating that an breast ultrasound was not ordered. Should we order?  She will have imaging on the 17th.

## 2014-07-23 NOTE — Telephone Encounter (Signed)
Pt states she discovered a lump in her left breast last night and is requesting a STAT order from PCP to have breast imaging done today.  Pt states she has a family history of breast cancer and she will be out of town all next week.  Patient contacted the [facility] this morning who was unable to schedule her an appt without an order.

## 2014-07-26 ENCOUNTER — Other Ambulatory Visit: Payer: Managed Care, Other (non HMO)

## 2014-07-27 NOTE — Telephone Encounter (Signed)
This has already been ordered.

## 2014-07-29 ENCOUNTER — Ambulatory Visit
Admission: RE | Admit: 2014-07-29 | Discharge: 2014-07-29 | Disposition: A | Discharge status: Home / Self Care | Payer: Managed Care, Other (non HMO) | Source: Ambulatory Visit | Attending: Internal Medicine | Admitting: Internal Medicine

## 2014-07-29 DIAGNOSIS — N6324 Unspecified lump in the left breast, lower inner quadrant: Secondary | ICD-10-CM

## 2014-08-27 ENCOUNTER — Other Ambulatory Visit: Payer: Self-pay

## 2014-09-01 ENCOUNTER — Telehealth: Payer: Self-pay | Admitting: Internal Medicine

## 2014-09-01 NOTE — Telephone Encounter (Signed)
Ok with me if ok with dr Maudie Mercury

## 2014-09-01 NOTE — Telephone Encounter (Signed)
Pt would like to switch to dr kim due to pt would like to go holistic. Pt loves dr Regis Bill.

## 2014-09-02 NOTE — Telephone Encounter (Signed)
I left a message for the pt to return my call. 

## 2014-09-02 NOTE — Telephone Encounter (Addendum)
I am a medical doctor and I am likely not any more holistic then Dr. Regis Bill. I prescribe medications and advise vaccines, etc and do not recommend supplements in most patients. I advise integrative medicine if she is looking for help with supplements, etc.

## 2014-09-07 NOTE — Telephone Encounter (Signed)
Patient informed and states she will think about this and call back.

## 2014-10-06 ENCOUNTER — Telehealth: Payer: Self-pay | Admitting: Internal Medicine

## 2014-10-06 NOTE — Telephone Encounter (Signed)
Pt would like cpx before end of yr. Can I create 30 min slot?

## 2014-10-11 NOTE — Telephone Encounter (Signed)
Ok but [employer] schedule the week of dec 21

## 2014-10-12 NOTE — Telephone Encounter (Signed)
Pt has been sch

## 2014-11-01 ENCOUNTER — Other Ambulatory Visit (INDEPENDENT_AMBULATORY_CARE_PROVIDER_SITE_OTHER): Payer: Managed Care, Other (non HMO)

## 2014-11-01 DIAGNOSIS — Z0000 Encounter for general adult medical examination without abnormal findings: Secondary | ICD-10-CM

## 2014-11-01 LAB — COMPREHENSIVE METABOLIC PANEL
ALBUMIN: 4.3 g/dL (ref 3.5–5.2)
ALK PHOS: 69 U/L (ref 39–117)
ALT: 23 U/L (ref 0–35)
AST: 22 U/L (ref 0–37)
BUN: 15 mg/dL (ref 6–23)
CALCIUM: 9.4 mg/dL (ref 8.4–10.5)
CO2: 30 mEq/L (ref 19–32)
Chloride: 101 mEq/L (ref 96–112)
Creatinine, Ser: 0.8 mg/dL (ref 0.4–1.2)
GFR: 81.98 mL/min (ref >60.00)
Glucose, Bld: 93 mg/dL (ref 70–99)
POTASSIUM: 3.9 meq/L (ref 3.5–5.1)
Sodium: 137 mEq/L (ref 135–145)
Total Bilirubin: 0.7 mg/dL (ref 0.2–1.2)
Total Protein: 7.2 g/dL (ref 6.0–8.3)

## 2014-11-01 LAB — LIPID PANEL
CHOLESTEROL: 154 mg/dL (ref 0–200)
HDL: 85.4 mg/dL (ref >39.00)
LDL CALC: 63 mg/dL (ref 0–99)
NonHDL: 68.6
TRIGLYCERIDES: 27 mg/dL (ref 0.0–149.0)
Total CHOL/HDL Ratio: 2
VLDL: 5.4 mg/dL (ref 0.0–40.0)

## 2014-11-01 LAB — CBC WITH DIFFERENTIAL/PLATELET
BASOS PCT: 1 % (ref 0.0–3.0)
Basophils Absolute: 0.1 10*3/uL (ref 0.0–0.1)
Eosinophils Absolute: 0.1 10*3/uL (ref 0.0–0.7)
Eosinophils Relative: 2.2 % (ref 0.0–5.0)
HEMATOCRIT: 42 % (ref 36.0–46.0)
Hemoglobin: 13.7 g/dL (ref 12.0–15.0)
Lymphocytes Relative: 32.4 % (ref 12.0–46.0)
Lymphs Abs: 1.7 10*3/uL (ref 0.7–4.0)
MCHC: 32.7 g/dL (ref 30.0–36.0)
MCV: 87 fl (ref 78.0–100.0)
Monocytes Absolute: 0.6 10*3/uL (ref 0.1–1.0)
Monocytes Relative: 10.9 % (ref 3.0–12.0)
NEUTROS PCT: 53.5 % (ref 43.0–77.0)
Neutro Abs: 2.8 10*3/uL (ref 1.4–7.7)
Platelets: 280 10*3/uL (ref 150.0–400.0)
RBC: 4.82 Mil/uL (ref 3.87–5.11)
RDW: 13 % (ref 11.5–15.5)
WBC: 5.3 10*3/uL (ref 4.0–10.5)

## 2014-11-02 LAB — TSH: TSH: 37.83 u[IU]/mL — ABNORMAL HIGH (ref 0.35–4.50)

## 2014-11-02 LAB — VITAMIN D 25 HYDROXY (VIT D DEFICIENCY, FRACTURES): VITD: 33.5 ng/mL (ref 30.00–100.00)

## 2014-11-08 ENCOUNTER — Ambulatory Visit (INDEPENDENT_AMBULATORY_CARE_PROVIDER_SITE_OTHER): Payer: Managed Care, Other (non HMO) | Admitting: Internal Medicine

## 2014-11-08 ENCOUNTER — Encounter: Payer: Self-pay | Admitting: Internal Medicine

## 2014-11-08 VITALS — BP 116/70 | Temp 98.0°F | Ht 65.5 in | Wt 147.8 lb

## 2014-11-08 DIAGNOSIS — E039 Hypothyroidism, unspecified: Secondary | ICD-10-CM

## 2014-11-08 DIAGNOSIS — Z0000 Encounter for general adult medical examination without abnormal findings: Secondary | ICD-10-CM

## 2014-11-08 NOTE — Progress Notes (Signed)
Pre visit review using our clinic review tool, if applicable. No additional management support is needed unless otherwise documented below in the visit note.  Chief Complaint  Patient presents with  . Annual Exam    HPI: Patient  Misty Frederick  58 y.o. comes in today for Preventive Health Care visit   No major changes in health  Now married . Taking synthroid am  Dr Forde Dandy changed dose after ts obtained No major   Sx  Has noise in ears for a while  When paying attention  No  Cp sob  Hx of neg carotid arteries .  Feels well  Health Maintenance  Topic Date Due  . INFLUENZA VACCINE  06/12/2014  . COLONOSCOPY  04/01/2015  . MAMMOGRAM  07/29/2016  . PAP SMEAR  09/18/2016  . TETANUS/TDAP  06/19/2017   Health Maintenance Review LIFESTYLE:  Exercise:  no Tobacco/ETS:no Alcohol: no Sugar beverages: no Sleep: interrupted 7  Drug use: no Colonoscopy:  Due 2016   Polyp on 3 year recall lebaur  PAP: due  MAMMO:utd    ROS:  GEN/ HEENT: No fever, significant weight changes sweats headaches vision problems hearing changes, CV/ PULM; No chest pain shortness of breath cough, syncope,edema  change in exercise tolerance. GI /GU: No adominal pain, vomiting, change in bowel habits. No blood in the stool. No significant GU symptoms. SKIN/HEME: ,no acute skin rashes suspicious lesions or bleeding. No lymphadenopathy, nodules, masses.  NEURO/ PSYCH:  No neurologic signs such as weakness numbness. No depression anxiety. right  tlateral foot numb  IMM/ Allergy: No unusual infections.  Allergy .   REST of 12 system review negative except as per HPI   Past Medical History  Diagnosis Date  . Allergy   . Asthma   . History of nephrolithiasis   . ADHD (attention deficit hyperactivity disorder)   . Endometriosis   . Blood transfusion 1985  . HYPOTHYROIDISM 06/20/2007    Qualifier: Diagnosis of  By: Hulan Saas, CMA (AAMA), [place] Per dr Forde Dandy     Past Surgical History  Procedure Laterality  Date  . Kidney stone removal    . Tonsillectomy and adenoidectomy  1985    Family History  Problem Relation Age of Onset  . Coronary artery disease Mother     mom less than age 66 smoked  pvd aaa amputation  . Nephrolithiasis Sister   . [place]om[place] syndrome Sister     and niece  . Peripheral vascular disease      mom sis tobacco  . Breast cancer Sister     History   Social History  . Marital Status: Married    Spouse Name: N/A    Number of Children: N/A  . Years of Education: N/A   Social History Main Topics  . Smoking status: Former Research scientist (life sciences)  . Smokeless tobacco: Never Used  . Alcohol Use: No  . Drug Use: No  . Sexual Activity: None   Other Topics Concern  . None   Social History Narrative   hh of 2 +1    2 dogs    Single mom  recently married 2015  child off to college    Nef ets etoh     Outpatient Encounter Prescriptions as of 11/08/2014  Medication Sig  . SYNTHROID 125 MCG tablet   . SYNTHROID 150 MCG tablet   . [DISCONTINUED] levothyroxine (SYNTHROID, LEVOTHROID) 100 MCG tablet Take 100 mcg by mouth daily before breakfast.    EXAM:  BP  116/70 mmHg  Temp(Src) 98 F (36.7 C) (Oral)  Ht 5' 5.5" (1.664 m)  Wt 147 lb 12.8 oz (67.042 kg)  BMI 24.21 kg/m2  Body mass index is 24.21 kg/(m^2).  Physical Exam: Vital signs reviewed UQJ:FHLK is a well-developed well-nourished alert cooperative    who appearsr stated age in no acute distress.  HEENT: normocephalic atraumatic , Eyes: PERRL EOM's full, conjunctiva clear, Nares: paten,t no deformity discharge or tenderness., Ears: no deformity EAC's clear TMs with normal landmarks. Mouth: clear OP, no lesions, edema.  Moist mucous membranes. Dentition in adequate repair. NECK: supple without masses, thyromegaly or bruits. CHEST/PULM:  Clear to auscultation and percussion breath sounds equal no wheeze , rales or rhonchi. No chest wall deformities or tenderness. Breast: to be done by gyne. CV: PMI is  nondisplaced, S1 S2 no gallops, murmurs, rubs. Peripheral pulses are full without delay.No JVD .  ABDOMEN: Bowel sounds normal nontender  No guard or rebound, no hepato splenomegal no CVA tenderness.  No hernia. Extremtities:  No clubbing cyanosis or edema, no acute joint swelling or redness no focal atrophy  Mild scoliosis  NEURO:  Oriented x3, cranial nerves 3-12 appear to be intact, no obvious focal weakness,gait within normal limits no abnormal reflexes or asymmetrical SKIN: No acute rashes normal turgor, color, no bruising or petechiae. PSYCH: Oriented, good eye contact, no obvious depression anxiety, cognition and judgment appear normal. LN: no cervical axillary inguinal adenopathy  Lab Results  Component Value Date   WBC 5.3 11/01/2014   HGB 13.7 11/01/2014   HCT 42.0 11/01/2014   PLT 280.0 11/01/2014   GLUCOSE 93 11/01/2014   CHOL 154 11/01/2014   TRIG 27.0 11/01/2014   HDL 85.40 11/01/2014   LDLCALC 63 11/01/2014   ALT 23 11/01/2014   AST 22 11/01/2014   NA 137 11/01/2014   K 3.9 11/01/2014   CL 101 11/01/2014   CREATININE 0.8 11/01/2014   BUN 15 11/01/2014   CO2 30 11/01/2014   TSH 37.83* 11/01/2014   HGBA1C 5.3 12/23/2007    ASSESSMENT AND PLAN:  Discussed the following assessment and plan:  Visit for preventive health examination  Hypothyroidism, unspecified hypothyroidism type Uncertain what to make of the  Ear sounds pt not worried   Get thyroid corrected and if continues consider other recheck ent etc I  Hear no bruits at this time.  Patient Care Team: Burnis Medin, MD as PCP - [place], MD as Attending Physician (Endocrinology) Daria Pastures, MD as Attending Physician (Obstetrics and Gynecology) Patient Instructions  Agree with getting thyroid dose in right range  Can fu if ear sounds  persistent or progressive    Healthy lifestyle includes : At least 150 minutes of exercise weeks  , weight at healthy levels, which is usually    BMI 19-25. Avoid trans fats and processed foods;  Increase fresh fruits and veges to 5 servings per day. And avoid sweet beverages including tea and juice. Mediterranean diet with olive oil and nuts have been noted to be heart and brain healthy . Avoid tobacco products . Limit  alcohol to  7 per week for women and 14 servings for men.  Get adequate sleep . Wear seat belts . Don't text and drive .   Get your mammogram and  Colon     Standley Brooking. Panosh M.D.

## 2014-11-08 NOTE — Patient Instructions (Signed)
Agree with getting thyroid dose in right range  Can fu if ear sounds  persistent or progressive    Healthy lifestyle includes : At least 150 minutes of exercise weeks  , weight at healthy levels, which is usually   BMI 19-25. Avoid trans fats and processed foods;  Increase fresh fruits and veges to 5 servings per day. And avoid sweet beverages including tea and juice. Mediterranean diet with olive oil and nuts have been noted to be heart and brain healthy . Avoid tobacco products . Limit  alcohol to  7 per week for women and 14 servings for men.  Get adequate sleep . Wear seat belts . Don't text and drive .   Get your mammogram and  Colon

## 2014-11-09 ENCOUNTER — Ambulatory Visit: Payer: Managed Care, Other (non HMO) | Admitting: Internal Medicine

## 2014-11-16 ENCOUNTER — Telehealth: Payer: Self-pay | Admitting: Internal Medicine

## 2014-11-16 NOTE — Telephone Encounter (Signed)
Pt has had a stomach bug for 7 days. Started last Tuesday and was letting up over the weekend. Sunday was her first day that she was able to eat and was not queasy. It started back after her dinner last night with watery diarrhea. Wants sone guidance on what she can take as she is not able to leave the house. Has Imodium but wanted to make sure it is ok to take.

## 2014-11-16 NOTE — Telephone Encounter (Signed)
Spoke to the pt.  No blood, fever, or abdominal pain.  Had vomiting on first day of illness.  Not any longer.  She is able to eat and drink. Per WP, pt okay to take Imodium.  Follow directions on the packaging.  Call back in a few days if not better.

## 2015-02-16 ENCOUNTER — Encounter: Payer: Self-pay | Admitting: Internal Medicine

## 2015-06-27 ENCOUNTER — Other Ambulatory Visit: Payer: Self-pay

## 2015-06-27 DIAGNOSIS — Z1231 Encounter for screening mammogram for malignant neoplasm of breast: Secondary | ICD-10-CM

## 2015-08-18 ENCOUNTER — Ambulatory Visit
Admission: RE | Admit: 2015-08-18 | Discharge: 2015-08-18 | Disposition: A | Discharge status: Home / Self Care | Payer: Managed Care, Other (non HMO) | Source: Ambulatory Visit

## 2015-08-18 DIAGNOSIS — Z1231 Encounter for screening mammogram for malignant neoplasm of breast: Secondary | ICD-10-CM

## 2015-11-29 ENCOUNTER — Encounter: Payer: Self-pay | Admitting: Internal Medicine

## 2015-12-28 ENCOUNTER — Ambulatory Visit (AMBULATORY_SURGERY_CENTER): Payer: Self-pay

## 2015-12-28 VITALS — Ht 66.0 in | Wt 146.8 lb

## 2015-12-28 DIAGNOSIS — Z860100 Personal history of colon polyps, unspecified: Secondary | ICD-10-CM

## 2015-12-28 MED ORDER — SUPREP BOWEL PREP KIT 17.5-3.13-1.6 GM/177ML PO SOLN
1.0000 | Freq: Once | ORAL | Status: DC
Start: 2015-12-28 — End: 2016-01-04

## 2015-12-28 NOTE — Progress Notes (Signed)
No allergies to eggs or soy No past problems with anesthesia No home oxygen No diet/weight loss meds  Has email and internet; refused emmi 

## 2016-01-04 ENCOUNTER — Encounter: Payer: Self-pay | Admitting: Internal Medicine

## 2016-01-04 ENCOUNTER — Ambulatory Visit (AMBULATORY_SURGERY_CENTER): Payer: Managed Care, Other (non HMO) | Admitting: Internal Medicine

## 2016-01-04 VITALS — BP 120/65 | HR 74 | Temp 97.8°F | Resp 12 | Ht 66.0 in | Wt 146.0 lb

## 2016-01-04 DIAGNOSIS — Z86010 Personal history of colonic polyps: Secondary | ICD-10-CM | POA: Diagnosis present

## 2016-01-04 HISTORY — PX: COLONOSCOPY: SHX174

## 2016-01-04 MED ORDER — SODIUM CHLORIDE 0.9 % IV SOLN: 500.0000 mL | INTRAVENOUS | Status: DC

## 2016-01-04 NOTE — Op Note (Signed)
[facility]  [address] [place], [id number]   COLONOSCOPY PROCEDURE REPORT  PATIENT: Misty, Frederick  MR#: [id number] BIRTHDATE: [birth year] , 59  yrs. old GENDER: female ENDOSCOPIST: Eustace Quail, MD REFERRED [id number] Program Recall PROCEDURE DATE:  01/04/2016 PROCEDURE:   Colonoscopy, surveillance First Screening Colonoscopy - Avg.  risk and is 50 yrs.  old or older - No.  Prior Negative Screening - Now for repeat screening. N/A  History of Adenoma - Now for follow-up colonoscopy & has been > or = to 3 yrs.  Yes hx of adenoma.  Has been 3 or more years since last colonoscopy.  Polyps removed today? No Recommend repeat exam, <10 yrs? Yes high risk ASA CLASS:   Class II INDICATIONS:Surveillance due to prior colonic neoplasia and PH Colon Adenoma.  . 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  The digital rectal exam revealed no abnormalities of the rectum.   The [id number]  endoscope was introduced through the anus and advanced to the cecum, which was identified by both the appendix and ileocecal valve. No adverse events experienced.   The quality of the prep was excellent. (Suprep was used)  The instrument was then slowly withdrawn as the colon was fully examined. Estimated blood loss is zero unless otherwise noted in this procedure report.      COLON FINDINGS: A normal appearing cecum, ileocecal valve, and appendiceal orifice were identified. The terminal ileum was intubated and appeared normal. The fatty fold was identified but not photographed.  The ascending, transverse, descending, sigmoid colon, and rectum appeared unremarkable.  Retroflexed views revealed no abnormalities. The time to cecum = 3.5 Withdrawal time = 8.9    The scope was withdrawn and the procedure completed. COMPLICATIONS: There were no immediate complications.  ENDOSCOPIC IMPRESSION: Normal colonoscopy  RECOMMENDATIONS: Follow up colonoscopy in 5 years  (Advanced adenoma on index exam)  eSigned:  Eustace Quail, MD 01/04/2016 2:14 PM   cc: The Patient

## 2016-01-04 NOTE — Patient Instructions (Signed)
YOU HAD AN ENDOSCOPIC PROCEDURE TODAY AT THE [facility]:   Refer to the procedure report that was given to you for any specific questions about what was found during the examination.  If the procedure report does not answer your questions, please call your gastroenterologist to clarify.  If you requested that your care partner not be given the details of your procedure findings, then the procedure report has been included in a sealed envelope for you to review at your convenience later.  YOU SHOULD EXPECT: Some feelings of bloating in the abdomen. Passage of more gas than usual.  Walking can help get rid of the air that was put into your GI tract during the procedure and reduce the bloating. If you had a lower endoscopy (such as a colonoscopy or flexible sigmoidoscopy) you may notice spotting of blood in your stool or on the toilet paper. If you underwent a bowel prep for your procedure, you may not have a normal bowel movement for a few days.  Please Note:  You might notice some irritation and congestion in your nose or some drainage.  This is from the oxygen used during your procedure.  There is no need for concern and it should clear up in a day or so.  SYMPTOMS TO REPORT IMMEDIATELY:   Following lower endoscopy (colonoscopy or flexible sigmoidoscopy):  Excessive amounts of blood in the stool  Significant tenderness or worsening of abdominal pains  Swelling of the abdomen that is new, acute  Fever of 100F or higher   For urgent or emergent issues, a gastroenterologist can be reached at any hour by calling [phone].   DIET: Your first meal following the procedure should be a small meal and then it is ok to progress to your normal diet. Heavy or fried foods are harder to digest and may make you feel nauseous or bloated.  Likewise, meals heavy in dairy and vegetables can increase bloating.  Drink plenty of fluids but you should avoid alcoholic beverages for 24  hours.  ACTIVITY:  You should plan to take it easy for the rest of today and you should NOT DRIVE or use heavy machinery until tomorrow (because of the sedation medicines used during the test).    FOLLOW UP: Our staff will call the number listed on your records the next business day following your procedure to check on you and address any questions or concerns that you may have regarding the information given to you following your procedure. If we do not reach you, we will leave a message.  However, if you are feeling well and you are not experiencing any problems, there is no need to return our call.  We will assume that you have returned to your regular daily activities without incident.  If any biopsies were taken you will be contacted by phone or by letter within the next 1-3 weeks.  Please call us at [phone] if you have not heard about the biopsies in 3 weeks.    SIGNATURES/CONFIDENTIALITY: You and/or your care partner have signed paperwork which will be entered into your electronic medical record.  These signatures attest to the fact that that the information above on your After Visit Summary has been reviewed and is understood.  Full responsibility of the confidentiality of this discharge information lies with you and/or your care-partner.   Resume medications. 

## 2016-01-04 NOTE — Progress Notes (Signed)
To Pacu-Pt awake and alert  Report to RN 

## 2016-01-05 ENCOUNTER — Telehealth: Payer: Self-pay | Admitting: *Deleted

## 2016-01-05 NOTE — Telephone Encounter (Signed)
  Follow up Call-  Call back number 01/04/2016  Post procedure Call Back phone  # [phone]  Permission to leave phone message Yes     Patient questions:  Do you have a fever, pain , or abdominal swelling? No. Pain Score  0 *  Have you tolerated food without any problems? Yes.    Have you been able to return to your normal activities? Yes.    Do you have any questions about your discharge instructions: Diet   No. Medications  No. Follow up visit  No.  Do you have questions or concerns about your Care? No.  Actions: * If pain score is 4 or above: No action needed, pain <4.

## 2017-08-12 ENCOUNTER — Other Ambulatory Visit: Payer: Self-pay | Admitting: Obstetrics and Gynecology

## 2017-08-12 DIAGNOSIS — Z1231 Encounter for screening mammogram for malignant neoplasm of breast: Secondary | ICD-10-CM

## 2017-08-30 ENCOUNTER — Ambulatory Visit
Admission: RE | Admit: 2017-08-30 | Discharge: 2017-08-30 | Disposition: A | Discharge status: Home / Self Care | Payer: 59 | Source: Ambulatory Visit | Attending: Obstetrics and Gynecology | Admitting: Obstetrics and Gynecology

## 2017-08-30 DIAGNOSIS — Z1231 Encounter for screening mammogram for malignant neoplasm of breast: Secondary | ICD-10-CM

## 2018-04-29 ENCOUNTER — Encounter: Payer: Self-pay | Admitting: Cardiovascular Disease

## 2018-04-29 ENCOUNTER — Telehealth (HOSPITAL_COMMUNITY): Payer: Self-pay

## 2018-04-29 ENCOUNTER — Ambulatory Visit (INDEPENDENT_AMBULATORY_CARE_PROVIDER_SITE_OTHER): Payer: 59 | Admitting: Cardiovascular Disease

## 2018-04-29 VITALS — BP 116/70 | HR 70 | Ht 67.0 in | Wt 141.0 lb

## 2018-04-29 DIAGNOSIS — R002 Palpitations: Secondary | ICD-10-CM | POA: Insufficient documentation

## 2018-04-29 NOTE — Telephone Encounter (Signed)
Encounter complete. 

## 2018-04-29 NOTE — Assessment & Plan Note (Signed)
Misty Frederick was referred by Dr. Forde Dandy for evaluation of new onset palpitations.  She has no cardiac risk factors other than family history with her mother and sister who have had ischemic heart disease.  Smokes.  She is not diabetic she does have hypothyroidism and is on Synthroid replacement with a low TSH.  She also was drinking one half pot of coffee a day which she has since discontinued.  Is not under undue stress.  Her palpitations began 2 weeks ago and they sound by description like PVCs.  She was hypotensive on one occasion.  She really denies chest pain or shortness of breath.  I am going to get a routine GXT, 2D echocardiogram and 2-week event monitor to further evaluate.

## 2018-04-29 NOTE — Progress Notes (Signed)
04/29/2018 Misty Frederick   06-30-1956  [id number]  Primary Physician Adrian Prince, MD Primary Cardiologist: Runell Gess MD Nicholes Calamity, [place]  HPI:  Misty Frederick is a 62 y.o. 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  She was referred by Dr. Ardyth Harps for evaluation of new onset palpitations.  She has no cardiac risk factors other than family history with her mother and sister both of who had ischemic heart disease.  She is never had a heart attack or stroke.  She denies chest pain or shortness of breath.  She does have a history of hypothyroidism on Synthroid replacement with a low TSH.  She also was drinking one half pot of coffee a day which she has since discontinued.  She noted new onset palpitations approximately 2 weeks ago for unclear reasons.  She is not under undue stress at work or at home.  Palpitations were occurring daily.  They were not necessarily related to activity.  Since stopping caffeine intake they have somewhat improved.  Her Synthroid dose was also adjusted.   Current Meds  Medication Sig  . levothyroxine (SYNTHROID, LEVOTHROID) 112 MCG tablet Take 112 mcg by mouth daily before breakfast.     Allergies  Allergen Reactions  . Macrobid [Nitrofurantoin Macrocrystal] Hives and Swelling    Onset 36 hours after finishing med see  OV    Social History   Socioeconomic History  . Marital status: Married    Spouse name: Not on file  . Number of children: Not on file  . Years of education: Not on file  . Highest education level: Not on file  Occupational History  . Not on file  Social Needs  . Financial resource strain: Not on file  . Food insecurity:    Worry: Not on file    Inability: Not on file  . Transportation needs:    Medical: Not on file    Non-medical: Not on file  Tobacco Use  . Smoking status: Former Games developer  . Smokeless tobacco: Never Used    Substance and Sexual Activity  . Alcohol use: No  . Drug use: No  . Sexual activity: Not on file  Lifestyle  . Physical activity:    Days per week: Not on file    Minutes per session: Not on file  . Stress: Not on file  Relationships  . Social connections:    Talks on phone: Not on file    Gets together: Not on file    Attends religious service: Not on file    Active member of club or organization: Not on file    Attends meetings of clubs or organizations: Not on file    Relationship status: Not on file  . Intimate partner violence:    Fear of current or ex partner: Not on file    Emotionally abused: Not on file    Physically abused: Not on file    Forced sexual activity: Not on file  Other Topics Concern  . Not on file  Social History Narrative   hh of 2 +1    2 dogs    Single mom  recently married 2015  child off to college    Nef ets etoh      Review of Systems: General: negative for chills, fever, night sweats or weight changes.  Cardiovascular: negative for chest pain, dyspnea on exertion, edema, orthopnea,  palpitations, paroxysmal nocturnal dyspnea or shortness of breath Dermatological: negative for rash Respiratory: negative for cough or wheezing Urologic: negative for hematuria Abdominal: negative for nausea, vomiting, diarrhea, bright red blood per rectum, melena, or hematemesis Neurologic: negative for visual changes, syncope, or dizziness All other systems reviewed and are otherwise negative except as noted above.    Blood pressure 116/70, pulse 70, height 5\' 7"  (1.702 m), weight 141 lb (64 kg).  General appearance: alert and no distress Neck: no adenopathy, no carotid bruit, no JVD, supple, symmetrical, trachea midline and thyroid not enlarged, symmetric, no tenderness/mass/nodules Lungs: clear to auscultation bilaterally Heart: regular rate and rhythm, S1, S2 normal, no murmur, click, rub or gallop Extremities: extremities normal, atraumatic, no cyanosis  or edema Pulses: 2+ and symmetric Skin: Skin color, texture, turgor normal. No rashes or lesions Neurologic: Alert and oriented X 3, normal strength and tone. Normal symmetric reflexes. Normal coordination and gait  EKG sinus rhythm at 70 without ST or T wave changes.  I personally reviewed this EKG.  ASSESSMENT AND PLAN:   Palpitations [name] was referred by Dr. Evlyn Kanner for evaluation of new onset palpitations.  She has no cardiac risk factors other than family history with her mother and sister who have had ischemic heart disease.  Smokes.  She is not diabetic she does have hypothyroidism and is on Synthroid replacement with a low TSH.  She also was drinking one half pot of coffee a day which she has since discontinued.  Is not under undue stress.  Her palpitations began 2 weeks ago and they sound by description like PVCs.  She was hypotensive on one occasion.  She really denies chest pain or shortness of breath.  I am going to get a routine GXT, 2D echocardiogram and 2-week event monitor to further evaluate.      Runell Gess MD FACP,FACC,FAHA, [facility] 04/29/2018 8:28 AM

## 2018-04-29 NOTE — Patient Instructions (Signed)
Medication Instructions: Your physician recommends that you continue on your current medications as directed. Please refer to the Current Medication list given to you today.   Testing/Procedures:  NEED ALL TESTING AS SOON AS POSSIBLE:  Your physician has requested that you have an echocardiogram. Echocardiography is a painless test that uses sound waves to create images of your heart. It provides your doctor with information about the size and shape of your heart and how well your heart's chambers and valves are working. This procedure takes approximately one hour. There are no restrictions for this procedure.  Your physician has requested that you have an exercise tolerance test. For further information please visit [email or web address]. Please also follow instruction sheet, as given.  Your physician has recommended that you wear a 14 day event monitor. Event monitors are medical devices that record the heart's electrical activity. Doctors most often [place] these monitors to diagnose arrhythmias. Arrhythmias are problems with the speed or rhythm of the heartbeat. The monitor is a small, portable device. You can wear one while you do your normal daily activities. This is usually used to diagnose what is causing palpitations/syncope (passing out).  Follow-Up: Your physician recommends that you schedule a follow-up appointment after testing with Dr. Gwenlyn Found.

## 2018-04-30 ENCOUNTER — Ambulatory Visit (HOSPITAL_COMMUNITY)
Admission: RE | Admit: 2018-04-30 | Discharge: 2018-04-30 | Disposition: A | Discharge status: Home / Self Care | Payer: 59 | Source: Ambulatory Visit | Attending: Cardiovascular Disease | Admitting: Cardiovascular Disease

## 2018-04-30 ENCOUNTER — Encounter (HOSPITAL_COMMUNITY): Payer: Self-pay | Admitting: *Deleted

## 2018-04-30 DIAGNOSIS — R002 Palpitations: Secondary | ICD-10-CM | POA: Insufficient documentation

## 2018-04-30 LAB — EXERCISE TOLERANCE TEST
CSEPED: 8 min
CSEPEDS: 10 s
CSEPEW: 10.1 METS
MPHR: 160 {beats}/min
Peak HR: 179 {beats}/min
Percent HR: 111 %
RPE: 16
Rest HR: 85 {beats}/min

## 2018-04-30 NOTE — Progress Notes (Signed)
ETT shown to Dr Gwenlyn Found due to BP response and upsloping ST depression. He said it was ok to let pt leave.

## 2018-05-01 ENCOUNTER — Ambulatory Visit (HOSPITAL_COMMUNITY): Payer: 59 | Attending: Cardiology

## 2018-05-01 ENCOUNTER — Other Ambulatory Visit: Payer: Self-pay

## 2018-05-01 DIAGNOSIS — I081 Rheumatic disorders of both mitral and tricuspid valves: Secondary | ICD-10-CM | POA: Diagnosis not present

## 2018-05-01 DIAGNOSIS — I5030 Unspecified diastolic (congestive) heart failure: Secondary | ICD-10-CM | POA: Diagnosis not present

## 2018-05-01 DIAGNOSIS — R002 Palpitations: Secondary | ICD-10-CM | POA: Insufficient documentation

## 2018-05-07 ENCOUNTER — Other Ambulatory Visit (HOSPITAL_COMMUNITY): Payer: 59

## 2018-05-07 ENCOUNTER — Ambulatory Visit (INDEPENDENT_AMBULATORY_CARE_PROVIDER_SITE_OTHER): Payer: 59

## 2018-05-07 DIAGNOSIS — R002 Palpitations: Secondary | ICD-10-CM | POA: Diagnosis not present

## 2018-05-23 ENCOUNTER — Ambulatory Visit: Payer: 59 | Admitting: Cardiovascular Disease

## 2018-05-23 ENCOUNTER — Encounter: Payer: Self-pay | Admitting: Cardiovascular Disease

## 2018-05-23 DIAGNOSIS — R002 Palpitations: Secondary | ICD-10-CM

## 2018-05-23 NOTE — Assessment & Plan Note (Signed)
History of palpitations with event monitor that showed occasional PVCs and PACs, 2D echo was normal except for mild diastolic dysfunction and a routine GXT was normal except for hypertensive cardiovascular response.  At this point, I am not can start any additional medications.  I have cleared her to exercise without limitation and I will see her back in 6 months.

## 2018-05-23 NOTE — Progress Notes (Signed)
History of palpitations with event monitor that showed occasional PVCs and PACs, 2D echo was normal except for mild diastolic dysfunction and a routine GXT was normal except for hypertensive cardiovascular response.  At this point, I am not can start any additional medications.  I have cleared her to exercise without limitation and I will see her back in 6 months.   Lorretta Harp, M.D., [place], [facility], [place] [address] [place], [place]  [address]  [phone] 05/23/2018 10:36 AM

## 2018-05-23 NOTE — Patient Instructions (Signed)
Medication Instructions:  Your physician recommends that you continue on your current medications as directed. Please refer to the Current Medication list given to you today.   Labwork: NONE  Testing/Procedures: NONE  Follow-Up: Your physician wants you to follow-up in: [place]. You will receive a reminder letter in the mail two months in advance. If you don't receive a letter, please call our office to schedule the follow-up appointment.   Any Other Special Instructions Will Be Listed Below (If Applicable).     If you need a refill on your cardiac medications before your next appointment, please call your pharmacy.

## 2018-09-09 ENCOUNTER — Other Ambulatory Visit: Payer: Self-pay | Admitting: Obstetrics and Gynecology

## 2018-09-09 DIAGNOSIS — Z1231 Encounter for screening mammogram for malignant neoplasm of breast: Secondary | ICD-10-CM

## 2018-09-10 ENCOUNTER — Ambulatory Visit
Admission: RE | Admit: 2018-09-10 | Discharge: 2018-09-10 | Disposition: A | Discharge status: Home / Self Care | Payer: 59 | Source: Ambulatory Visit | Attending: Obstetrics and Gynecology | Admitting: Obstetrics and Gynecology

## 2018-09-10 DIAGNOSIS — Z1231 Encounter for screening mammogram for malignant neoplasm of breast: Secondary | ICD-10-CM

## 2019-03-06 ENCOUNTER — Telehealth: Payer: Self-pay | Admitting: *Deleted

## 2019-03-06 NOTE — Telephone Encounter (Signed)
Left message for patient to call and schedule 6 mos recall--virtual/telephone with Dr. Berry 

## 2019-08-24 ENCOUNTER — Other Ambulatory Visit: Payer: Self-pay | Admitting: Obstetrics and Gynecology

## 2019-08-24 DIAGNOSIS — Z1231 Encounter for screening mammogram for malignant neoplasm of breast: Secondary | ICD-10-CM

## 2019-10-07 ENCOUNTER — Ambulatory Visit
Admission: RE | Admit: 2019-10-07 | Discharge: 2019-10-07 | Disposition: A | Discharge status: Home / Self Care | Payer: 59 | Source: Ambulatory Visit | Attending: Obstetrics and Gynecology | Admitting: Obstetrics and Gynecology

## 2019-10-07 ENCOUNTER — Other Ambulatory Visit: Payer: Self-pay

## 2019-10-07 DIAGNOSIS — Z1231 Encounter for screening mammogram for malignant neoplasm of breast: Secondary | ICD-10-CM

## 2019-10-13 ENCOUNTER — Other Ambulatory Visit: Payer: Self-pay | Admitting: Obstetrics and Gynecology

## 2019-10-13 DIAGNOSIS — R928 Other abnormal and inconclusive findings on diagnostic imaging of breast: Secondary | ICD-10-CM

## 2019-10-14 ENCOUNTER — Other Ambulatory Visit: Payer: Self-pay

## 2019-10-14 ENCOUNTER — Ambulatory Visit
Admission: RE | Admit: 2019-10-14 | Discharge: 2019-10-14 | Disposition: A | Discharge status: Home / Self Care | Payer: 59 | Source: Ambulatory Visit | Attending: Obstetrics and Gynecology | Admitting: Obstetrics and Gynecology

## 2019-10-14 DIAGNOSIS — R928 Other abnormal and inconclusive findings on diagnostic imaging of breast: Secondary | ICD-10-CM

## 2019-10-15 ENCOUNTER — Other Ambulatory Visit: Payer: 59

## 2019-10-21 ENCOUNTER — Other Ambulatory Visit: Payer: 59

## 2019-12-15 ENCOUNTER — Telehealth: Payer: Self-pay | Admitting: *Deleted

## 2019-12-15 NOTE — Telephone Encounter (Signed)
A message was left, re: her follow up visit. 

## 2019-12-18 ENCOUNTER — Ambulatory Visit (INDEPENDENT_AMBULATORY_CARE_PROVIDER_SITE_OTHER): Payer: Managed Care, Other (non HMO) | Admitting: Cardiovascular Disease

## 2019-12-18 ENCOUNTER — Other Ambulatory Visit: Payer: Self-pay

## 2019-12-18 ENCOUNTER — Encounter: Payer: Self-pay | Admitting: Cardiovascular Disease

## 2019-12-18 VITALS — BP 139/79 | HR 65 | Temp 97.9°F | Ht 67.0 in | Wt 141.8 lb

## 2019-12-18 DIAGNOSIS — R002 Palpitations: Secondary | ICD-10-CM | POA: Diagnosis not present

## 2019-12-18 DIAGNOSIS — I5189 Other ill-defined heart diseases: Secondary | ICD-10-CM | POA: Insufficient documentation

## 2019-12-18 NOTE — Assessment & Plan Note (Signed)
Patient palpitations without event monitoring that has shown only occasional PVCs.  She is noticed more of these recently when she has changed her decaf to regular coffee.  Otherwise she is asymptomatic.

## 2019-12-18 NOTE — Assessment & Plan Note (Signed)
2D echocardiogram performed 05/01/2018 showed normal LV function with grade 1 diastolic dysfunction.  She is asymptomatic from this.

## 2019-12-18 NOTE — Progress Notes (Signed)
12/18/2019 Misty Frederick   June 16, 1956  [id number]  Primary Physician Adrian Prince, MD Primary Cardiologist: Runell Gess MD Nicholes Calamity, [place]  HPI:  Misty Frederick is a 64 y.o.  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  She was referred by Dr. Ardyth Harps for evaluation of new onset palpitations.  I last saw her in the office 05/23/2018. She has no cardiac risk factors other than family history with her mother and sister both of who had ischemic heart disease.  She is never had a heart attack or stroke.  She denies chest pain or shortness of breath.  She does have a history of hypothyroidism on Synthroid replacement with a low TSH.  She also was drinking one half pot of coffee a day which she has since discontinued.  She noted new onset palpitations approximately 2 weeks ago for unclear reasons.  She is not under undue stress at work or at home.  Palpitations were occurring daily.  They were not necessarily related to activity.  Since stopping caffeine intake they have somewhat improved.  Her Synthroid dose was also adjusted.  She did have a 2D echo which was essentially normal except for grade 1 diastolic dysfunction and a routine GXT that showed no ischemic changes with a hypertensive response.  An event monitor showed rare PVCs.  She recently bought a Peloton home bicycle which she is about to embark on.  Unfortunately, her husband Nadine Counts, who is also patient mine, no subarachnoid hemorrhage April last year and is only recently recovered.  This has caused quite a bit of stress in their lives.   Current Meds  Medication Sig  . ALOE PO Take by mouth.  . Biotin 1 MG CAPS biotin  . Cholecalciferol (VITAMIN D3) 1.25 MG (50000 UT) CAPS Vitamin D3  . cyanocobalamin 100 MCG tablet Take 100 mcg by mouth daily.  Marland Kitchen levothyroxine (SYNTHROID, LEVOTHROID) 112 MCG tablet Take 112 mcg by mouth daily before  breakfast.  . LUTEIN PO Take by mouth.  . Omega-3 Fatty Acids (FISH OIL) 1000 MG CAPS Fish Oil  . [DISCONTINUED] Coenzyme Q10 (COQ-10) 100 MG CAPS CoQ-10     Allergies  Allergen Reactions  . Macrobid [Nitrofurantoin Macrocrystal] Hives and Swelling    Onset 36 hours after finishing med see  OV    Social History   Socioeconomic History  . Marital status: Married    Spouse name: Not on file  . Number of children: Not on file  . Years of education: Not on file  . Highest education level: Not on file  Occupational History  . Not on file  Tobacco Use  . Smoking status: Former Games developer  . Smokeless tobacco: Never Used  Substance and Sexual Activity  . Alcohol use: No  . Drug use: No  . Sexual activity: Not on file  Other Topics Concern  . Not on file  Social History Narrative   hh of 2 +1    2 dogs    Single mom  recently married 2015  child off to college    Nef ets etoh    Social Determinants of Health   Financial Resource Strain:   . Difficulty of Paying Living Expenses: Not on file  Food Insecurity:   . Worried About Programme researcher, broadcasting/film/video in the Last Year: Not on file  . Ran Out of Food in the Last Year:  Not on file  Transportation Needs:   . Lack of Transportation (Medical): Not on file  . Lack of Transportation (Non-Medical): Not on file  Physical Activity:   . Days of Exercise per Week: Not on file  . Minutes of Exercise per Session: Not on file  Stress:   . Feeling of Stress : Not on file  Social Connections:   . Frequency of Communication with Friends and Family: Not on file  . Frequency of Social Gatherings with Friends and Family: Not on file  . Attends Religious Services: Not on file  . Active Member of Clubs or Organizations: Not on file  . Attends Banker Meetings: Not on file  . Marital Status: Not on file  Intimate Partner Violence:   . Fear of Current or Ex-Partner: Not on file  . Emotionally Abused: Not on file  . Physically Abused:  Not on file  . Sexually Abused: Not on file     Review of Systems: General: negative for chills, fever, night sweats or weight changes.  Cardiovascular: negative for chest pain, dyspnea on exertion, edema, orthopnea, palpitations, paroxysmal nocturnal dyspnea or shortness of breath Dermatological: negative for rash Respiratory: negative for cough or wheezing Urologic: negative for hematuria Abdominal: negative for nausea, vomiting, diarrhea, bright red blood per rectum, melena, or hematemesis Neurologic: negative for visual changes, syncope, or dizziness All other systems reviewed and are otherwise negative except as noted above.    Blood pressure 139/79, pulse 65, temperature 97.9 F (36.6 C), height 5\' 7"  (1.702 m), weight 141 lb 12.8 oz (64.3 kg), SpO2 100 %.  General appearance: alert and no distress Neck: no adenopathy, no carotid bruit, no JVD, supple, symmetrical, trachea midline and thyroid not enlarged, symmetric, no tenderness/mass/nodules Lungs: clear to auscultation bilaterally Heart: regular rate and rhythm, S1, S2 normal, no murmur, click, rub or gallop Extremities: extremities normal, atraumatic, no cyanosis or edema Pulses: 2+ and symmetric Skin: Skin color, texture, turgor normal. No rashes or lesions Neurologic: Alert and oriented X 3, normal strength and tone. Normal symmetric reflexes. Normal coordination and gait  EKG sinus rhythm at 67 with incomplete right bundle branch block.  I personally reviewed this EKG.  ASSESSMENT AND PLAN:   Palpitations Patient palpitations without event monitoring that has shown only occasional PVCs.  She is noticed more of these recently when she has changed her decaf to regular coffee.  Otherwise she is asymptomatic.  Diastolic dysfunction 2D echocardiogram performed 05/01/2018 showed normal LV function with grade 1 diastolic dysfunction.  She is asymptomatic from this.      Runell Gess MD FACP,FACC,FAHA, [facility]  12/18/2019 9:08 AM

## 2019-12-18 NOTE — Patient Instructions (Signed)
Medication Instructions:  Your physician recommends that you continue on your current medications as directed. Please refer to the Current Medication list given to you today.  If you need a refill on your cardiac medications before your next appointment, please call your pharmacy.   Lab work: NONE  Testing/Procedures: NONE  Follow-Up: At [facility], you and your health needs are our priority.  As part of our continuing mission to provide you with exceptional heart care, we have created designated Provider Care Teams.  These Care Teams include your primary Cardiologist (physician) and Advanced Practice Providers (APPs -  Physician Assistants and Nurse Practitioners) who all work together to provide you with the care you need, when you need it. You may see Dr. Berry or one of the following Advanced Practice Providers on your designated Care Team:    Luke Kilroy, PA-C  Callie Goodrich, PA-C  Jesse Cleaver, FNP  Your physician wants you to follow-up in: 1 year with Dr. Berry       

## 2019-12-30 ENCOUNTER — Ambulatory Visit: Payer: 59 | Admitting: Cardiovascular Disease

## 2020-01-29 ENCOUNTER — Ambulatory Visit: Payer: 59

## 2020-08-19 ENCOUNTER — Other Ambulatory Visit: Payer: Self-pay | Admitting: Obstetrics and Gynecology

## 2020-08-19 DIAGNOSIS — N644 Mastodynia: Secondary | ICD-10-CM

## 2020-09-09 ENCOUNTER — Ambulatory Visit: Payer: 59

## 2020-09-09 ENCOUNTER — Ambulatory Visit
Admission: RE | Admit: 2020-09-09 | Discharge: 2020-09-09 | Disposition: A | Discharge status: Home / Self Care | Payer: 59 | Source: Ambulatory Visit | Attending: Obstetrics and Gynecology | Admitting: Obstetrics and Gynecology

## 2020-09-09 ENCOUNTER — Other Ambulatory Visit: Payer: Self-pay

## 2020-09-09 DIAGNOSIS — N644 Mastodynia: Secondary | ICD-10-CM

## 2020-12-01 ENCOUNTER — Other Ambulatory Visit: Payer: Self-pay | Admitting: Obstetrics and Gynecology

## 2020-12-01 DIAGNOSIS — Z1231 Encounter for screening mammogram for malignant neoplasm of breast: Secondary | ICD-10-CM

## 2020-12-20 ENCOUNTER — Other Ambulatory Visit: Payer: Self-pay

## 2020-12-20 ENCOUNTER — Ambulatory Visit
Admission: RE | Admit: 2020-12-20 | Discharge: 2020-12-20 | Disposition: A | Discharge status: Home / Self Care | Payer: 59 | Source: Ambulatory Visit | Attending: Obstetrics and Gynecology | Admitting: Obstetrics and Gynecology

## 2020-12-20 DIAGNOSIS — Z1231 Encounter for screening mammogram for malignant neoplasm of breast: Secondary | ICD-10-CM

## 2021-01-09 ENCOUNTER — Telehealth: Payer: Self-pay | Admitting: Cardiovascular Disease

## 2021-01-09 NOTE — Telephone Encounter (Signed)
2.28.22 LVM message to sch 1 yr FU w/ Dr Lorie Phenix lp

## 2021-02-02 ENCOUNTER — Other Ambulatory Visit: Payer: Self-pay | Admitting: Obstetrics and Gynecology

## 2021-02-02 DIAGNOSIS — E2839 Other primary ovarian failure: Secondary | ICD-10-CM

## 2021-06-13 ENCOUNTER — Encounter: Payer: Self-pay | Admitting: Internal Medicine

## 2021-08-08 ENCOUNTER — Other Ambulatory Visit: Payer: Self-pay

## 2021-08-08 ENCOUNTER — Ambulatory Visit
Admission: RE | Admit: 2021-08-08 | Discharge: 2021-08-08 | Disposition: A | Discharge status: Home / Self Care | Payer: 59 | Source: Ambulatory Visit | Attending: Obstetrics and Gynecology | Admitting: Obstetrics and Gynecology

## 2021-08-08 DIAGNOSIS — E2839 Other primary ovarian failure: Secondary | ICD-10-CM

## 2021-12-04 ENCOUNTER — Other Ambulatory Visit: Payer: Self-pay | Admitting: Obstetrics and Gynecology

## 2021-12-04 DIAGNOSIS — Z1231 Encounter for screening mammogram for malignant neoplasm of breast: Secondary | ICD-10-CM

## 2021-12-20 ENCOUNTER — Ambulatory Visit: Payer: 59

## 2021-12-21 ENCOUNTER — Ambulatory Visit: Payer: 59

## 2021-12-27 ENCOUNTER — Encounter: Payer: Self-pay | Admitting: Cardiovascular Disease

## 2021-12-29 ENCOUNTER — Ambulatory Visit
Admission: RE | Admit: 2021-12-29 | Discharge: 2021-12-29 | Disposition: A | Discharge status: Home / Self Care | Payer: 59 | Source: Ambulatory Visit | Attending: Obstetrics and Gynecology | Admitting: Obstetrics and Gynecology

## 2021-12-29 ENCOUNTER — Other Ambulatory Visit: Payer: Self-pay

## 2021-12-29 DIAGNOSIS — Z1231 Encounter for screening mammogram for malignant neoplasm of breast: Secondary | ICD-10-CM

## 2022-01-02 ENCOUNTER — Other Ambulatory Visit: Payer: Self-pay | Admitting: Obstetrics and Gynecology

## 2022-01-02 DIAGNOSIS — R928 Other abnormal and inconclusive findings on diagnostic imaging of breast: Secondary | ICD-10-CM

## 2022-01-23 ENCOUNTER — Ambulatory Visit
Admission: RE | Admit: 2022-01-23 | Discharge: 2022-01-23 | Disposition: A | Discharge status: Home / Self Care | Payer: 59 | Source: Ambulatory Visit | Attending: Obstetrics and Gynecology | Admitting: Obstetrics and Gynecology

## 2022-01-23 ENCOUNTER — Other Ambulatory Visit: Payer: Self-pay | Admitting: Obstetrics and Gynecology

## 2022-01-23 DIAGNOSIS — R928 Other abnormal and inconclusive findings on diagnostic imaging of breast: Secondary | ICD-10-CM

## 2022-01-23 DIAGNOSIS — N6489 Other specified disorders of breast: Secondary | ICD-10-CM

## 2022-03-14 ENCOUNTER — Ambulatory Visit: Payer: 59 | Admitting: Cardiovascular Disease

## 2022-03-14 ENCOUNTER — Encounter: Payer: Self-pay | Admitting: Cardiovascular Disease

## 2022-03-14 VITALS — BP 115/60 | HR 68 | Ht 66.0 in | Wt 145.6 lb

## 2022-03-14 DIAGNOSIS — E7800 Pure hypercholesterolemia, unspecified: Secondary | ICD-10-CM | POA: Diagnosis not present

## 2022-03-14 DIAGNOSIS — Z8249 Family history of ischemic heart disease and other diseases of the circulatory system: Secondary | ICD-10-CM

## 2022-03-14 DIAGNOSIS — R002 Palpitations: Secondary | ICD-10-CM | POA: Diagnosis not present

## 2022-03-14 NOTE — Assessment & Plan Note (Signed)
History of palpitations in the past with event monitor that showed PVCs.  Since she cut out caffeine and since stress in her life is reduced she no longer has palpitations.  I did also get a 2D echo that was normal and a GXT that was normal as well. ?

## 2022-03-14 NOTE — Patient Instructions (Signed)
?  Lab Work: ? ?Your physician recommends that you return for lab work FASTING ? ?If you have labs (blood work) drawn today and your tests are completely normal, you will receive your results only by: ?MyChart Message (if you have MyChart) OR ?A paper copy in the mail ?If you have any lab test that is abnormal or we need to change your treatment, we will call you to review the results. ? ? ?Follow-Up: ?At [facility], you and your health needs are our priority.  As part of our continuing mission to provide you with exceptional heart care, we have created designated Provider Care Teams.  These Care Teams include your primary Cardiologist (physician) and Advanced Practice Providers (APPs -  Physician Assistants and Nurse Practitioners) who all work together to provide you with the care you need, when you need it. ? ?We recommend signing up for the patient portal called "MyChart".  Sign up information is provided on this After Visit Summary.  MyChart is used to connect with patients for Virtual Visits (Telemedicine).  Patients are able to view lab/test results, encounter notes, upcoming appointments, etc.  Non-urgent messages can be sent to your provider as well.   ?To learn more about what you can do with MyChart, go to [email or web address].   ? ?Your next appointment:   ? AS NEEDED ? ?Important Information About Sugar ? ? ? ? ?  ?

## 2022-03-14 NOTE — Progress Notes (Signed)
? ? ? ?03/14/2022 ?Soundra Pilon   ?08-20-1956  ?[id number] ? ?Primary Physician Adrian Prince, MD ?Primary Cardiologist: Runell Gess MD Nicholes Calamity, [place] ? ?HPI:  Misty Frederick is a 66 y.o.  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  She was referred by Dr. Ardyth Harps for evaluation of new onset palpitations.  I last saw her in the office 12/18/2019. She has no cardiac risk factors other than family history with her mother and sister both of who had ischemic heart disease.  She is never had a heart attack or stroke.  She denies chest pain or shortness of breath.  She does have a history of hypothyroidism on Synthroid replacement with a low TSH.  She also was drinking one half pot of coffee a day which she has since discontinued.  She noted new onset palpitations approximately 2 weeks ago for unclear reasons.  She is not under undue stress at work or at home.  Palpitations were occurring daily.  They were not necessarily related to activity.  Since stopping caffeine intake they have somewhat improved.  Her Synthroid dose was also adjusted. ?  ?She did have a 2D echo which was essentially normal except for grade 1 diastolic dysfunction and a routine GXT that showed no ischemic changes with a hypertensive response.  An event monitor showed rare PVCs.  She recently bought a Peloton home bicycle which she is about to embark on.  Unfortunately, her husband Nadine Counts, who is also patient mine, had a subarachnoid hemorrhage April 2021 year and has had a slow recovery..  This has caused quite a bit of stress in their lives. ? ?Since I saw her 2 years ago she has done well.  She no longer has palpitations.  She works out on her [employer] but rarely.  She denies chest pain or shortness of breath. ? ? ?Current Meds  ?Medication Sig  ? Cholecalciferol (VITAMIN D3) 1.25 MG (50000 UT) CAPS Vitamin D3  ? [employer] SUNSCREEN SPF 30 EX apply  ?  cyanocobalamin 100 MCG tablet Take 100 mcg by mouth daily.  ? cycloSPORINE, PF, (CEQUA) 0.09 % SOLN Cequa 0.09 % eye drops in a dropperette ? Instill ONE drop into BOTH eyes TWICE daily  ? Estradiol 10 MCG TABS vaginal tablet Vagifem 10 mcg vaginal tablet ? INSERT 1 TABLET VAGINALLY TWICE A WEEK  ? levothyroxine (SYNTHROID) 112 MCG tablet 1 tablet in the morning on an empty stomach  ? levothyroxine (SYNTHROID, LEVOTHROID) 112 MCG tablet Take 112 mcg by mouth daily before breakfast.  ? LUTEIN PO Take by mouth.  ? Omega-3 Fatty Acids (FISH OIL) 1000 MG CAPS Fish Oil  ?  ? ?Allergies  ?Allergen Reactions  ? Macrobid [Nitrofurantoin Macrocrystal] Hives and Swelling  ?  Onset 36 hours after finishing med see  OV  ? ? ?Social History  ? ?Socioeconomic History  ? Marital status: Married  ?  Spouse name: Not on file  ? Number of children: Not on file  ? Years of education: Not on file  ? Highest education level: Not on file  ?Occupational History  ? Not on file  ?Tobacco Use  ? Smoking status: Former  ? Smokeless tobacco: Never  ?Substance and Sexual Activity  ? Alcohol use: No  ? Drug use: No  ? Sexual activity: Not on file  ?Other Topics Concern  ? Not on file  ?Social History Narrative  ?  hh of 2 +1   ? 2 dogs   ? Single mom  recently married 2015  child off to college   ? Nef ets etoh   ? ?Social Determinants of Health  ? ?Financial Resource Strain: Not on file  ?Food Insecurity: Not on file  ?Transportation Needs: Not on file  ?Physical Activity: Not on file  ?Stress: Not on file  ?Social Connections: Not on file  ?Intimate Partner Violence: Not on file  ?  ? ?Review of Systems: ?General: negative for chills, fever, night sweats or weight changes.  ?Cardiovascular: negative for chest pain, dyspnea on exertion, edema, orthopnea, palpitations, paroxysmal nocturnal dyspnea or shortness of breath ?Dermatological: negative for rash ?Respiratory: negative for cough or wheezing ?Urologic: negative for hematuria ?Abdominal:  negative for nausea, vomiting, diarrhea, bright red blood per rectum, melena, or hematemesis ?Neurologic: negative for visual changes, syncope, or dizziness ?All other systems reviewed and are otherwise negative except as noted above. ? ? ? ?Blood pressure 115/60, pulse 68, height 5\' 6"  (1.676 m), weight 145 lb 9.6 oz (66 kg), SpO2 92 %.  ?General appearance: alert and no distress ?Neck: no adenopathy, no carotid bruit, no JVD, supple, symmetrical, trachea midline, and thyroid not enlarged, symmetric, no tenderness/mass/nodules ?Lungs: clear to auscultation bilaterally ?Heart: regular rate and rhythm, S1, S2 normal, no murmur, click, rub or gallop ?Extremities: extremities normal, atraumatic, no cyanosis or edema ?Pulses: 2+ and symmetric ?Skin: Skin color, texture, turgor normal. No rashes or lesions ?Neurologic: Grossly normal ? ?EKG sinus rhythm at 68 without ST or T wave changes.  Personally reviewed this EKG. ? ?ASSESSMENT AND PLAN:  ? ?Palpitations ?History of palpitations in the past with event monitor that showed PVCs.  Since she cut out caffeine and since stress in her life is reduced she no longer has palpitations.  I did also get a 2D echo that was normal and a GXT that was normal as well. ? ? ? ? ?Runell Gess MD FACP,FACC,FAHA, FSCAI ?03/14/2022 ?4:05 PM ?

## 2022-04-25 ENCOUNTER — Ambulatory Visit (AMBULATORY_SURGERY_CENTER): Payer: 59 | Admitting: *Deleted

## 2022-04-25 VITALS — Ht 66.0 in | Wt 145.0 lb

## 2022-04-25 DIAGNOSIS — Z86010 Personal history of colonic polyps: Secondary | ICD-10-CM

## 2022-04-25 MED ORDER — NA SULFATE-K SULFATE-MG SULF 17.5-3.13-1.6 GM/177ML PO SOLN: 1.0000 | ORAL | 0 refills | Status: DC

## 2022-04-25 MED ORDER — ONDANSETRON HCL 4 MG PO TABS: 4.0000 mg | ORAL_TABLET | ORAL | 0 refills | Status: AC

## 2022-04-25 NOTE — Progress Notes (Signed)
Patient is here in-person for PV. Patient denies any allergies to eggs or soy. Patient denies any problems with anesthesia/sedation. Patient is not on any oxygen at home. Patient is not taking any diet/weight loss medications or blood thinners. Patient is aware of our care-partner policy. Patient notified to use Good-Rx for prescription.   EMMI education assigned to the patient for the procedure, sent to [place].

## 2022-05-08 DIAGNOSIS — N898 Other specified noninflammatory disorders of vagina: Secondary | ICD-10-CM | POA: Diagnosis not present

## 2022-05-17 ENCOUNTER — Encounter: Payer: Self-pay | Admitting: Internal Medicine

## 2022-05-23 ENCOUNTER — Ambulatory Visit (AMBULATORY_SURGERY_CENTER): Payer: PPO | Admitting: Internal Medicine

## 2022-05-23 ENCOUNTER — Encounter: Payer: Self-pay | Admitting: Internal Medicine

## 2022-05-23 VITALS — BP 119/39 | HR 72 | Temp 97.8°F | Resp 16 | Ht 66.0 in | Wt 145.0 lb

## 2022-05-23 DIAGNOSIS — Z09 Encounter for follow-up examination after completed treatment for conditions other than malignant neoplasm: Secondary | ICD-10-CM | POA: Diagnosis not present

## 2022-05-23 DIAGNOSIS — D122 Benign neoplasm of ascending colon: Secondary | ICD-10-CM

## 2022-05-23 DIAGNOSIS — Z86010 Personal history of colonic polyps: Secondary | ICD-10-CM | POA: Diagnosis not present

## 2022-05-23 MED ORDER — SODIUM CHLORIDE 0.9 % IV SOLN: 500.0000 mL | Freq: Once | INTRAVENOUS | Status: DC

## 2022-05-23 NOTE — Progress Notes (Signed)
Pt's states no medical or surgical changes since previsit or office visit. 

## 2022-05-23 NOTE — Progress Notes (Signed)
Called to room to assist during endoscopic procedure.  Patient ID and intended procedure confirmed with present staff. Received instructions for my participation in the procedure from the performing physician.  

## 2022-05-23 NOTE — Progress Notes (Signed)
Pt in recovery with monitors in place, VSS. Report given to receiving RN.  °

## 2022-05-23 NOTE — Op Note (Signed)
[facility] Patient Name: Misty Frederick Procedure Date: 05/23/2022 11:44 AM MRN: [id number] Endoscopist: Wilhemina Bonito. Marina Goodell , MD Age: 66 Referring MD:  Date of Birth: Dec 29, 1955 Gender: Female Account #: [id number] Procedure:                Colonoscopy with cold snare polypectomy x 1 Indications:              High risk colon cancer surveillance: Personal                            history of adenoma (10 mm or greater in size) on                            index exam 2013. Last surveillance examination 2017                            was negative for neoplasia Medicines:                Monitored Anesthesia Care Procedure:                Pre-Anesthesia Assessment:                           - Prior to the procedure, a History and Physical                            was performed, and patient medications and                            allergies were reviewed. The patient's tolerance of                            previous anesthesia was also reviewed. The risks                            and benefits of the procedure and the sedation                            options and risks were discussed with the patient.                            All questions were answered, and informed consent                            was obtained. Prior Anticoagulants: The patient has                            taken no previous anticoagulant or antiplatelet                            agents. ASA Grade Assessment: II - A patient with                            mild systemic disease. After reviewing the risks  and benefits, the patient was deemed in                            satisfactory condition to undergo the procedure.                           After obtaining informed consent, the colonoscope                            was passed under direct vision. Throughout the                            procedure, the patient's blood pressure, pulse, and                             oxygen saturations were monitored continuously. The                            CF [id number] #[id number] was introduced through the anus                            and advanced to the the cecum, identified by                            appendiceal orifice and ileocecal valve. The                            ileocecal valve, appendiceal orifice, and rectum                            were photographed. The quality of the bowel                            preparation was excellent. The colonoscopy was                            performed without difficulty. The patient tolerated                            the procedure well. The bowel preparation used was                            SUPREP via split dose instruction. Scope In: 12:02:35 PM Scope Out: 12:12:53 PM Scope Withdrawal Time: 0 hours 8 minutes 15 seconds  Total Procedure Duration: 0 hours 10 minutes 18 seconds  Findings:                 A 3 mm polyp was found in the ascending colon. The                            polyp was sessile. The polyp was removed with a                            cold snare. Resection and  retrieval were complete.                           The exam was otherwise without abnormality on                            direct and retroflexion views. Complications:            No immediate complications. Estimated blood loss:                            None. Estimated Blood Loss:     Estimated blood loss: none. Impression:               - One 3 mm polyp in the ascending colon, removed                            with a cold snare. Resected and retrieved.                           - The examination was otherwise normal on direct                            and retroflexion views. Recommendation:           - Repeat colonoscopy in 5 years for surveillance                            (personal history advanced adenoma).                           - Patient has a contact number available for                            emergencies. The  signs and symptoms of potential                            delayed complications were discussed with the                            patient. Return to normal activities tomorrow.                            Written discharge instructions were provided to the                            patient.                           - Resume previous diet.                           - Continue present medications.                           - Await pathology results. Wilhemina Bonito. Marina Goodell, MD 05/23/2022 12:20:01 PM This report has been signed electronically.

## 2022-05-23 NOTE — Patient Instructions (Signed)
Read all of the handouts given to you by your recovery room nurse.  YOU HAD AN ENDOSCOPIC PROCEDURE TODAY AT THE [facility]:   Refer to the procedure report that was given to you for any specific questions about what was found during the examination.  If the procedure report does not answer your questions, please call your gastroenterologist to clarify.  If you requested that your care partner not be given the details of your procedure findings, then the procedure report has been included in a sealed envelope for you to review at your convenience later.  YOU SHOULD EXPECT: Some feelings of bloating in the abdomen. Passage of more gas than usual.  Walking can help get rid of the air that was put into your GI tract during the procedure and reduce the bloating. If you had a lower endoscopy (such as a colonoscopy or flexible sigmoidoscopy) you may notice spotting of blood in your stool or on the toilet paper. If you underwent a bowel prep for your procedure, you may not have a normal bowel movement for a few days.  Please Note:  You might notice some irritation and congestion in your nose or some drainage.  This is from the oxygen used during your procedure.  There is no need for concern and it should clear up in a day or so.  SYMPTOMS TO REPORT IMMEDIATELY:  Following lower endoscopy (colonoscopy or flexible sigmoidoscopy):  Excessive amounts of blood in the stool  Significant tenderness or worsening of abdominal pains  Swelling of the abdomen that is new, acute  Fever of 100F or higher   For urgent or emergent issues, a gastroenterologist can be reached at any hour by calling [phone]. Do not use MyChart messaging for urgent concerns.    DIET:  We do recommend a small meal at first, but then you may proceed to your regular diet.  Drink plenty of fluids but you should avoid alcoholic beverages for 24 hours.  ACTIVITY:  You should plan to take it easy for the rest of today and  you should NOT DRIVE or use heavy machinery until tomorrow (because of the sedation medicines used during the test).    FOLLOW UP: Our staff will call the number listed on your records the next business day following your procedure.  We will call around 7:15- 8:00 am to check on you and address any questions or concerns that you may have regarding the information given to you following your procedure. If we do not reach you, we will leave a message.  If you develop any symptoms (ie: fever, flu-like symptoms, shortness of breath, cough etc.) before then, please call [phone].  If you test positive for Covid 19 in the 2 weeks post procedure, please call and report this information to us.    If any biopsies were taken you will be contacted by phone or by letter within the next 1-3 weeks.  Please call us at [phone] if you have not heard about the biopsies in 3 weeks.    SIGNATURES/CONFIDENTIALITY: You and/or your care partner have signed paperwork which will be entered into your electronic medical record.  These signatures attest to the fact that that the information above on your After Visit Summary has been reviewed and is understood.  Full responsibility of the confidentiality of this discharge information lies with you and/or your care-partner.  

## 2022-05-23 NOTE — Progress Notes (Signed)
HISTORY OF PRESENT ILLNESS:  Misty Frederick is a 66 y.o. female with a history of advanced adenoma in 2013.  Last examination 2017 was negative for neoplasia.  Now for surveillance  REVIEW OF SYSTEMS:  All non-GI ROS negative.  Past Medical History:  Diagnosis Date   ADHD (attention deficit hyperactivity disorder)    Allergy    Asthma    Blood transfusion 1985   Endometriosis    History of nephrolithiasis    HYPOTHYROIDISM 06/20/2007   Qualifier: Diagnosis of  By: Hulan Saas, CMA (AAMA), [place] Skye Per dr Forde Dandy     Past Surgical History:  Procedure Laterality Date   COLONOSCOPY  01/04/2016   Dr.Cordarrius Coad   kidney stone removal     TONSILLECTOMY AND ADENOIDECTOMY  11/13/1983    Social History Cameron Katayama  reports that she has quit smoking. She has never used smokeless tobacco. She reports that she does not drink alcohol and does not use drugs.  family history includes Breast cancer in her sister; Coronary artery disease in her mother; Nephrolithiasis in her sister; Peripheral vascular disease in an other family member; Yves Dill Parkinson White syndrome in her sister.  Allergies  Allergen Reactions   Macrobid [Nitrofurantoin Macrocrystal] Hives and Swelling    Onset 36 hours after finishing med see  OV       PHYSICAL EXAMINATION: Vital signs: BP 121/66   Pulse 75   Temp 97.8 F (36.6 C)   Resp 14   Ht '5\' 6"'$  (1.676 m)   Wt 145 lb (65.8 kg)   SpO2 99%   BMI 23.40 kg/m  General: Well-developed, well-nourished, no acute distress HEENT: Sclerae are anicteric, conjunctiva pink. Oral mucosa intact Lungs: Clear Heart: Regular Abdomen: soft, nontender, nondistended, no obvious ascites, no peritoneal signs, normal bowel sounds. No organomegaly. Extremities: No edema Psychiatric: alert and oriented x3. Cooperative      ASSESSMENT:   History of advanced adenoma  PLAN:  Surveillance colonoscopy

## 2022-05-24 ENCOUNTER — Telehealth: Payer: Self-pay

## 2022-05-24 NOTE — Telephone Encounter (Signed)
  Follow up Call-     05/23/2022   10:59 AM  Call back number  Post procedure Call Back phone  # [phone]  Permission to leave phone message Yes   Follow up call, LVM.

## 2022-05-25 ENCOUNTER — Encounter: Payer: Self-pay | Admitting: Internal Medicine

## 2022-08-03 ENCOUNTER — Ambulatory Visit
Admission: RE | Admit: 2022-08-03 | Discharge: 2022-08-03 | Disposition: A | Discharge status: Home / Self Care | Payer: 59 | Source: Ambulatory Visit | Attending: Obstetrics and Gynecology | Admitting: Obstetrics and Gynecology

## 2022-08-03 DIAGNOSIS — N6489 Other specified disorders of breast: Secondary | ICD-10-CM

## 2022-08-03 DIAGNOSIS — R922 Inconclusive mammogram: Secondary | ICD-10-CM | POA: Diagnosis not present

## 2022-08-28 DIAGNOSIS — D2271 Melanocytic nevi of right lower limb, including hip: Secondary | ICD-10-CM | POA: Diagnosis not present

## 2022-08-28 DIAGNOSIS — L821 Other seborrheic keratosis: Secondary | ICD-10-CM | POA: Diagnosis not present

## 2022-08-28 DIAGNOSIS — Z85828 Personal history of other malignant neoplasm of skin: Secondary | ICD-10-CM | POA: Diagnosis not present

## 2022-08-28 DIAGNOSIS — L814 Other melanin hyperpigmentation: Secondary | ICD-10-CM | POA: Diagnosis not present

## 2022-08-28 DIAGNOSIS — L578 Other skin changes due to chronic exposure to nonionizing radiation: Secondary | ICD-10-CM | POA: Diagnosis not present

## 2022-08-28 DIAGNOSIS — D225 Melanocytic nevi of trunk: Secondary | ICD-10-CM | POA: Diagnosis not present

## 2022-09-08 IMAGING — US US BREAST*R* LIMITED INC AXILLA
1 series · 6 of 6 positions shown · non-contrast
Comparison: Previous exam(s).

CLINICAL DATA: Screening recall for a possible asymmetry in the
right breast.

EXAM:
DIGITAL DIAGNOSTIC UNILATERAL RIGHT MAMMOGRAM WITH TOMOSYNTHESIS AND
CAD; ULTRASOUND RIGHT BREAST LIMITED
TECHNIQUE: Right digital diagnostic mammography and breast tomosynthesis was
performed. The images were evaluated with computer-aided detection.;
Targeted ultrasound examination of the right breast was performed

[Series 1: us breast*right* limited inc axilla · 0.07mm/px · 6 of 6 slices shown]
[im 1/6]
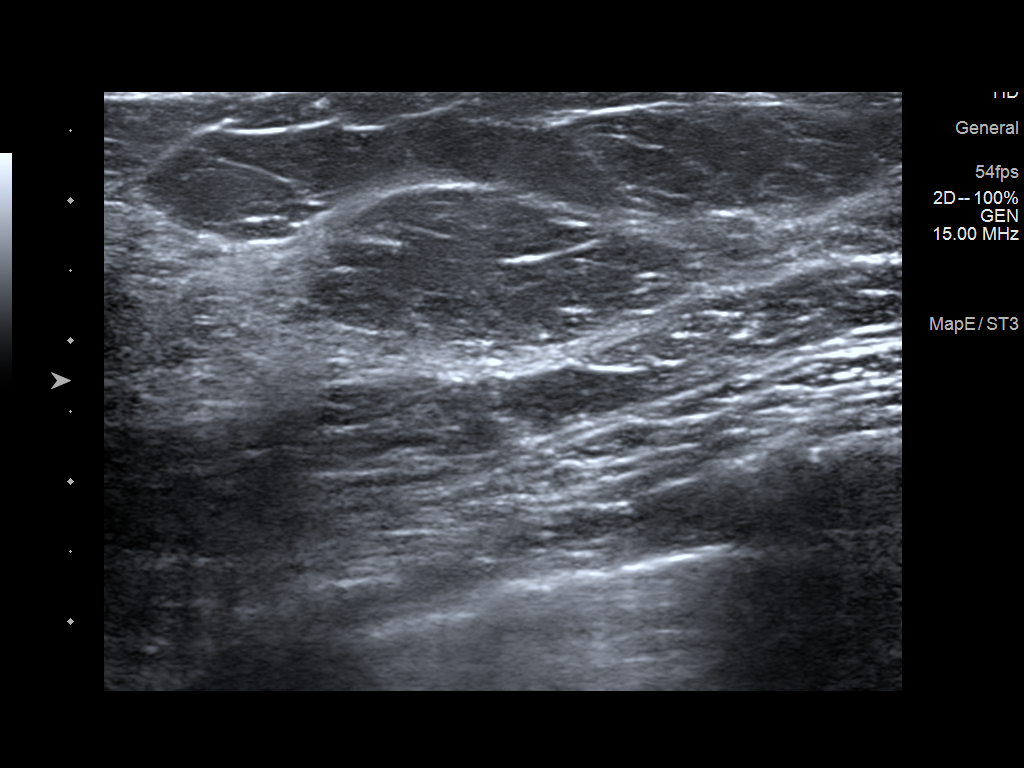
[im 2/6]
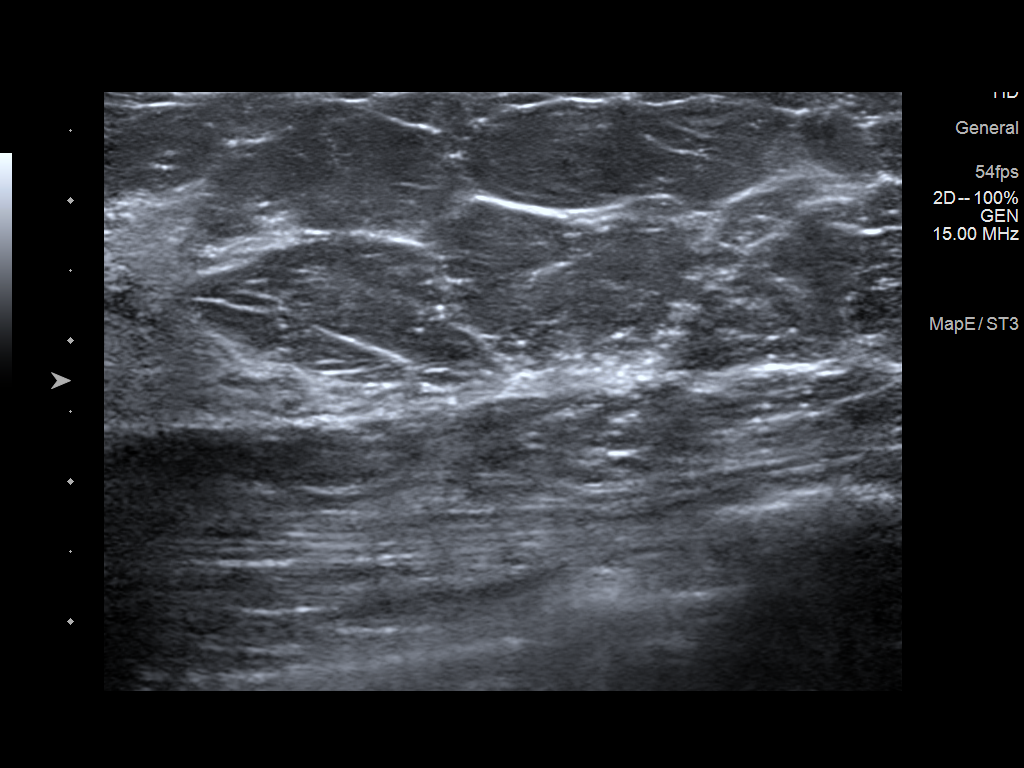
[im 3/6]
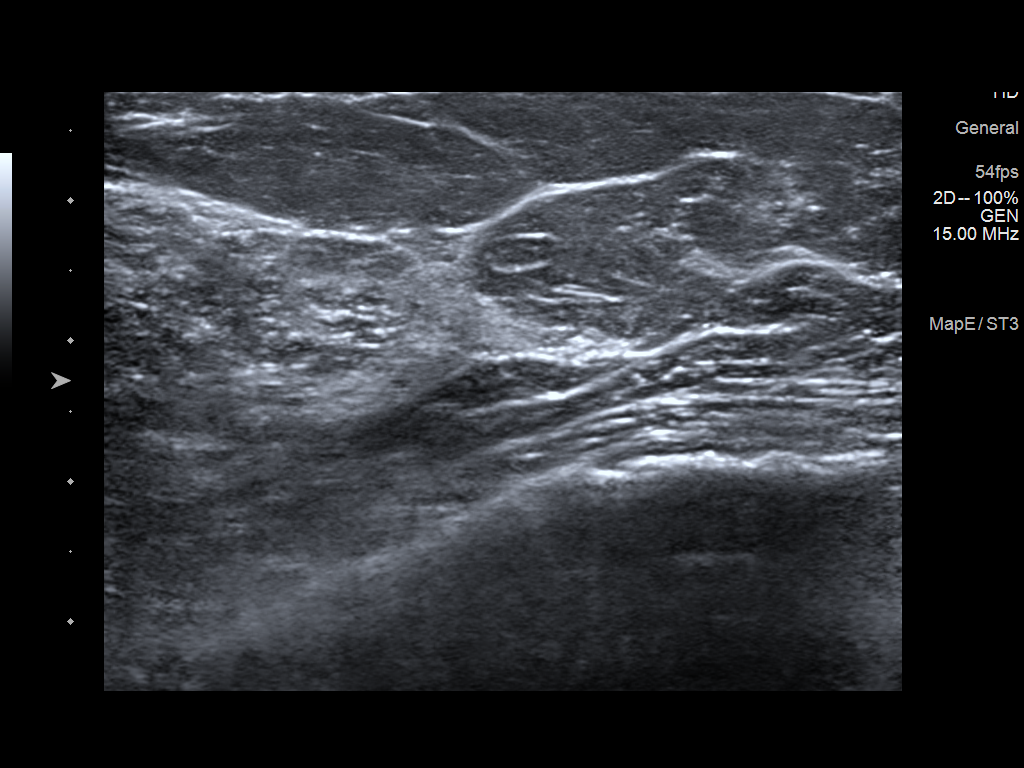
[im 4/6]
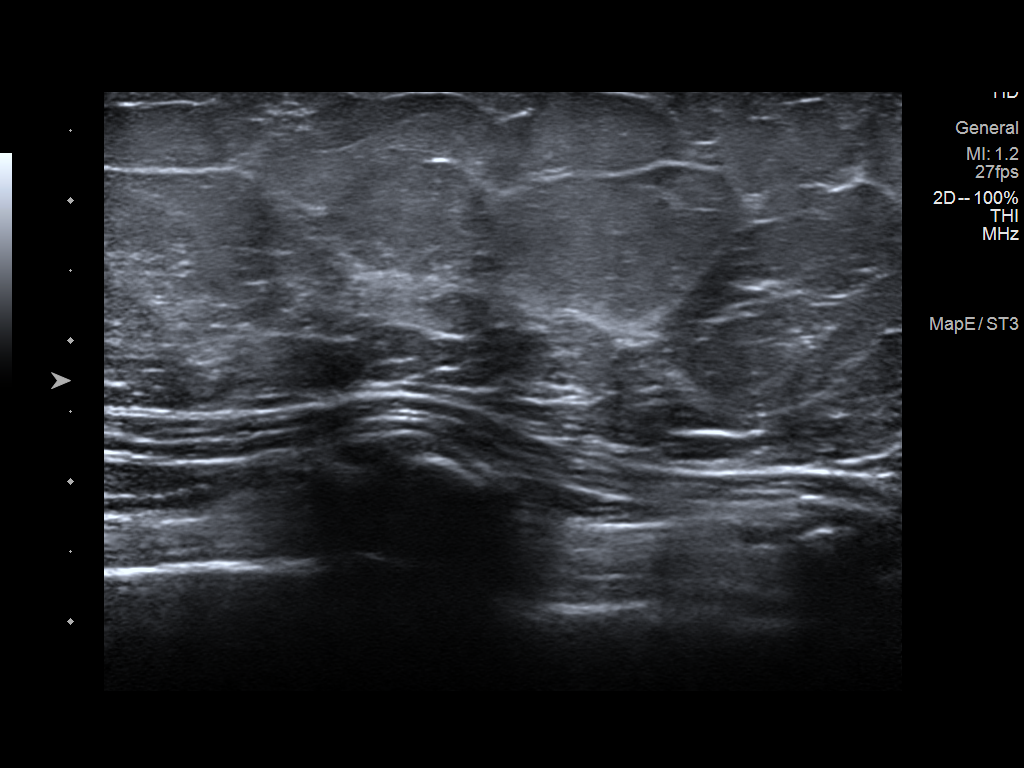
[im 5/6]
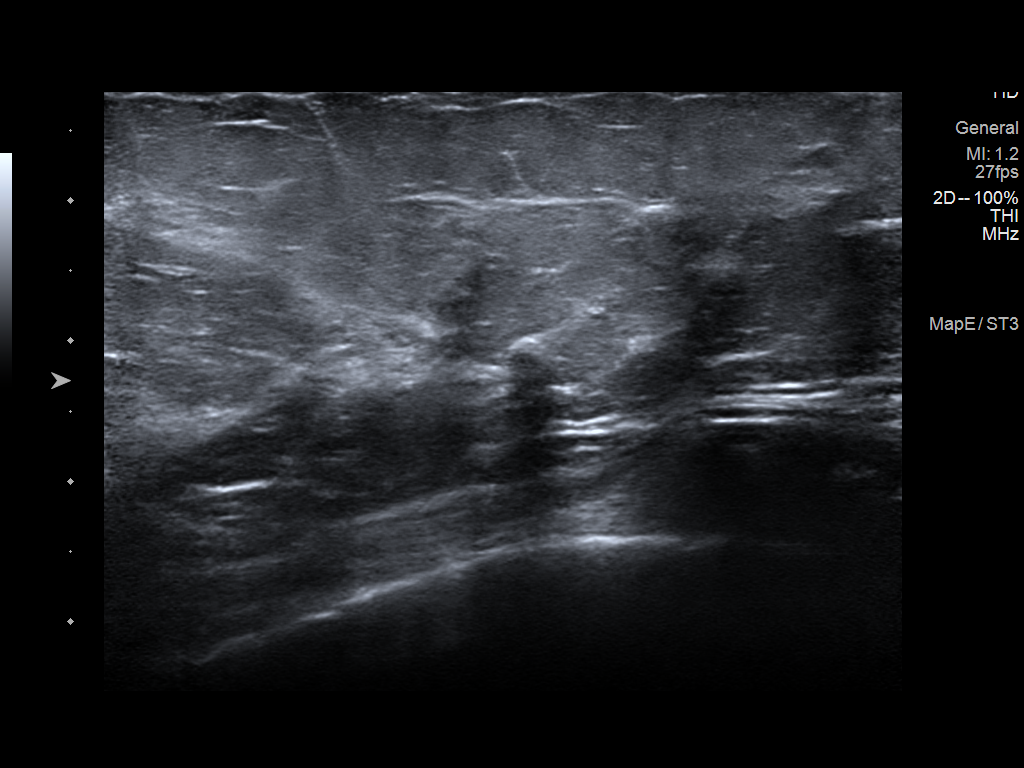
[im 6/6]
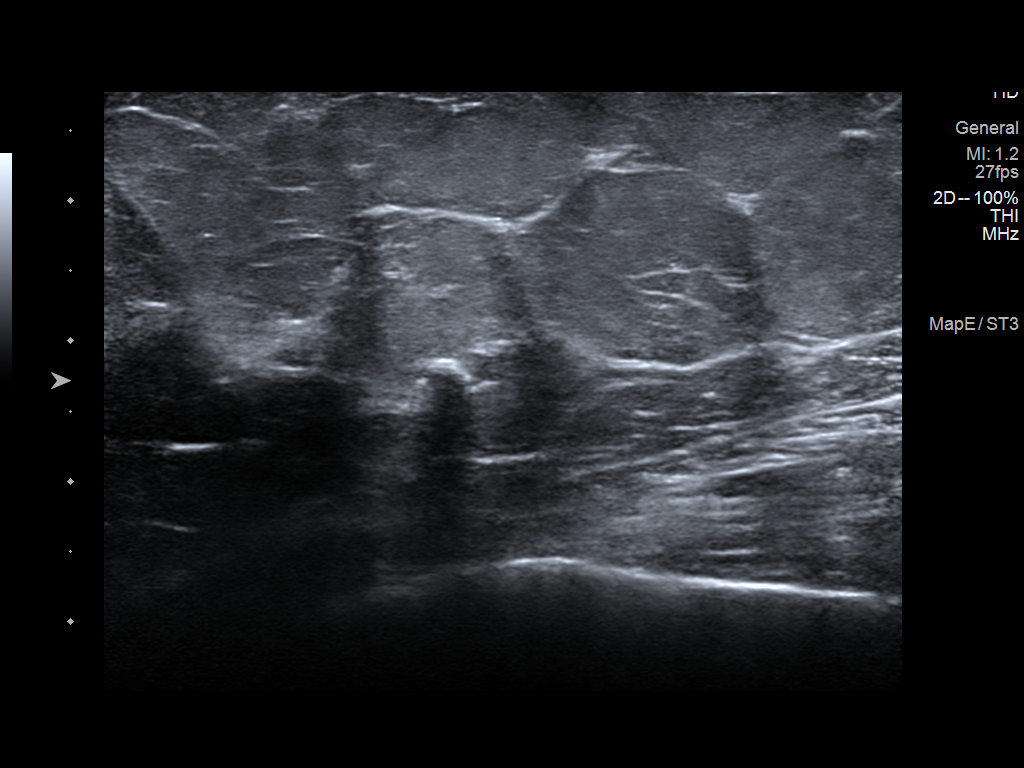

[6 of 6 positions shown; findings below may reference images not displayed]

ACR Breast Density Category c: The breast tissue is heterogeneously
dense, which may obscure small masses.
FINDINGS: The possible asymmetry, which was noted medial aspect of the right
breast on the screening cc view, mostly disperses on spot
compression imaging consistent with superimposed fibroglandular
tissue. A small and subtle asymmetry persists, projecting in the
medial posterior right breast between 2 and 4 o'clock. There are no
defined masses. There are no suspicious calcifications.

Targeted right breast ultrasound is performed, showing normal tissue
throughout the medial right breast. No mass or suspicious lesion. No
sonographic architectural distortion.
IMPRESSION: 1. Small and subtle probably benign asymmetry in the medial right
breast. Short-term follow-up recommended.

RECOMMENDATION:
Diagnostic right breast mammography in 6 months to reassess the
subtle small residual asymmetry, which is most likely superimposed
normal fibroglandular tissue.

I have discussed the findings and recommendations with the patient.
If applicable, a reminder letter will be sent to the patient
regarding the next appointment.

BI-RADS CATEGORY  3: Probably benign.

## 2022-11-21 DIAGNOSIS — H0012 Chalazion right lower eyelid: Secondary | ICD-10-CM | POA: Diagnosis not present

## 2023-01-30 ENCOUNTER — Other Ambulatory Visit: Payer: Self-pay | Admitting: Obstetrics and Gynecology

## 2023-01-30 DIAGNOSIS — Z1231 Encounter for screening mammogram for malignant neoplasm of breast: Secondary | ICD-10-CM

## 2023-02-01 DIAGNOSIS — M7501 Adhesive capsulitis of right shoulder: Secondary | ICD-10-CM | POA: Diagnosis not present

## 2023-02-12 DIAGNOSIS — H2513 Age-related nuclear cataract, bilateral: Secondary | ICD-10-CM | POA: Diagnosis not present

## 2023-02-12 DIAGNOSIS — H16223 Keratoconjunctivitis sicca, not specified as Sjogren's, bilateral: Secondary | ICD-10-CM | POA: Diagnosis not present

## 2023-02-13 DIAGNOSIS — M7501 Adhesive capsulitis of right shoulder: Secondary | ICD-10-CM | POA: Diagnosis not present

## 2023-02-13 DIAGNOSIS — M25611 Stiffness of right shoulder, not elsewhere classified: Secondary | ICD-10-CM | POA: Diagnosis not present

## 2023-02-21 DIAGNOSIS — Z01419 Encounter for gynecological examination (general) (routine) without abnormal findings: Secondary | ICD-10-CM | POA: Diagnosis not present

## 2023-02-21 DIAGNOSIS — N898 Other specified noninflammatory disorders of vagina: Secondary | ICD-10-CM | POA: Diagnosis not present

## 2023-02-21 DIAGNOSIS — N952 Postmenopausal atrophic vaginitis: Secondary | ICD-10-CM | POA: Diagnosis not present

## 2023-02-21 DIAGNOSIS — N760 Acute vaginitis: Secondary | ICD-10-CM | POA: Diagnosis not present

## 2023-03-01 DIAGNOSIS — M7501 Adhesive capsulitis of right shoulder: Secondary | ICD-10-CM | POA: Diagnosis not present

## 2023-03-19 DIAGNOSIS — R7989 Other specified abnormal findings of blood chemistry: Secondary | ICD-10-CM | POA: Diagnosis not present

## 2023-03-19 DIAGNOSIS — E039 Hypothyroidism, unspecified: Secondary | ICD-10-CM | POA: Diagnosis not present

## 2023-03-20 ENCOUNTER — Ambulatory Visit
Admission: RE | Admit: 2023-03-20 | Discharge: 2023-03-20 | Disposition: A | Discharge status: Home / Self Care | Payer: PPO | Source: Ambulatory Visit | Attending: Obstetrics and Gynecology | Admitting: Obstetrics and Gynecology

## 2023-03-20 DIAGNOSIS — Z1231 Encounter for screening mammogram for malignant neoplasm of breast: Secondary | ICD-10-CM | POA: Diagnosis not present

## 2023-04-10 DIAGNOSIS — D72819 Decreased white blood cell count, unspecified: Secondary | ICD-10-CM | POA: Diagnosis not present

## 2023-04-10 DIAGNOSIS — E039 Hypothyroidism, unspecified: Secondary | ICD-10-CM | POA: Diagnosis not present

## 2023-04-10 DIAGNOSIS — R002 Palpitations: Secondary | ICD-10-CM | POA: Diagnosis not present

## 2023-04-10 DIAGNOSIS — R030 Elevated blood-pressure reading, without diagnosis of hypertension: Secondary | ICD-10-CM | POA: Diagnosis not present

## 2023-04-10 DIAGNOSIS — E559 Vitamin D deficiency, unspecified: Secondary | ICD-10-CM | POA: Diagnosis not present

## 2023-04-17 DIAGNOSIS — M4186 Other forms of scoliosis, lumbar region: Secondary | ICD-10-CM | POA: Diagnosis not present

## 2023-04-17 DIAGNOSIS — K635 Polyp of colon: Secondary | ICD-10-CM | POA: Diagnosis not present

## 2023-04-17 DIAGNOSIS — M5412 Radiculopathy, cervical region: Secondary | ICD-10-CM | POA: Diagnosis not present

## 2023-04-17 DIAGNOSIS — R030 Elevated blood-pressure reading, without diagnosis of hypertension: Secondary | ICD-10-CM | POA: Diagnosis not present

## 2023-04-17 DIAGNOSIS — Z0000 Encounter for general adult medical examination without abnormal findings: Secondary | ICD-10-CM | POA: Diagnosis not present

## 2023-04-17 DIAGNOSIS — R7301 Impaired fasting glucose: Secondary | ICD-10-CM | POA: Diagnosis not present

## 2023-04-17 DIAGNOSIS — R002 Palpitations: Secondary | ICD-10-CM | POA: Diagnosis not present

## 2023-04-17 DIAGNOSIS — Z87442 Personal history of urinary calculi: Secondary | ICD-10-CM | POA: Diagnosis not present

## 2023-04-17 DIAGNOSIS — M5416 Radiculopathy, lumbar region: Secondary | ICD-10-CM | POA: Diagnosis not present

## 2023-04-17 DIAGNOSIS — J45909 Unspecified asthma, uncomplicated: Secondary | ICD-10-CM | POA: Diagnosis not present

## 2023-04-17 DIAGNOSIS — E039 Hypothyroidism, unspecified: Secondary | ICD-10-CM | POA: Diagnosis not present

## 2023-04-17 DIAGNOSIS — E559 Vitamin D deficiency, unspecified: Secondary | ICD-10-CM | POA: Diagnosis not present

## 2023-04-17 DIAGNOSIS — R82998 Other abnormal findings in urine: Secondary | ICD-10-CM | POA: Diagnosis not present

## 2023-05-08 DIAGNOSIS — R300 Dysuria: Secondary | ICD-10-CM | POA: Diagnosis not present

## 2023-05-08 DIAGNOSIS — N760 Acute vaginitis: Secondary | ICD-10-CM | POA: Diagnosis not present

## 2023-05-30 DIAGNOSIS — N760 Acute vaginitis: Secondary | ICD-10-CM | POA: Diagnosis not present

## 2023-07-08 DIAGNOSIS — R300 Dysuria: Secondary | ICD-10-CM | POA: Diagnosis not present

## 2023-07-18 DIAGNOSIS — R319 Hematuria, unspecified: Secondary | ICD-10-CM | POA: Diagnosis not present

## 2023-07-24 DIAGNOSIS — N952 Postmenopausal atrophic vaginitis: Secondary | ICD-10-CM | POA: Diagnosis not present

## 2023-07-24 DIAGNOSIS — R310 Gross hematuria: Secondary | ICD-10-CM | POA: Diagnosis not present

## 2023-07-24 DIAGNOSIS — N200 Calculus of kidney: Secondary | ICD-10-CM | POA: Diagnosis not present

## 2023-07-24 DIAGNOSIS — Z87442 Personal history of urinary calculi: Secondary | ICD-10-CM | POA: Diagnosis not present

## 2023-08-02 DIAGNOSIS — R310 Gross hematuria: Secondary | ICD-10-CM | POA: Diagnosis not present

## 2023-08-05 ENCOUNTER — Ambulatory Visit: Payer: PPO

## 2023-08-11 ENCOUNTER — Other Ambulatory Visit: Payer: Self-pay

## 2023-08-11 ENCOUNTER — Emergency Department (HOSPITAL_COMMUNITY)
Admission: EM | Admit: 2023-08-11 | Discharge: 2023-08-11 | Disposition: A | Discharge status: Home / Self Care | Payer: PPO | Attending: Emergency Medicine | Admitting: Emergency Medicine

## 2023-08-11 ENCOUNTER — Emergency Department (HOSPITAL_COMMUNITY): Payer: PPO

## 2023-08-11 ENCOUNTER — Encounter (HOSPITAL_COMMUNITY): Payer: Self-pay

## 2023-08-11 DIAGNOSIS — N132 Hydronephrosis with renal and ureteral calculous obstruction: Secondary | ICD-10-CM | POA: Diagnosis not present

## 2023-08-11 DIAGNOSIS — Z720 Tobacco use: Secondary | ICD-10-CM | POA: Insufficient documentation

## 2023-08-11 DIAGNOSIS — J45909 Unspecified asthma, uncomplicated: Secondary | ICD-10-CM | POA: Diagnosis not present

## 2023-08-11 DIAGNOSIS — N200 Calculus of kidney: Secondary | ICD-10-CM

## 2023-08-11 DIAGNOSIS — R1111 Vomiting without nausea: Secondary | ICD-10-CM | POA: Diagnosis not present

## 2023-08-11 DIAGNOSIS — I10 Essential (primary) hypertension: Secondary | ICD-10-CM | POA: Diagnosis not present

## 2023-08-11 DIAGNOSIS — R197 Diarrhea, unspecified: Secondary | ICD-10-CM | POA: Diagnosis not present

## 2023-08-11 DIAGNOSIS — Z79890 Hormone replacement therapy (postmenopausal): Secondary | ICD-10-CM | POA: Insufficient documentation

## 2023-08-11 DIAGNOSIS — N134 Hydroureter: Secondary | ICD-10-CM | POA: Diagnosis not present

## 2023-08-11 DIAGNOSIS — E039 Hypothyroidism, unspecified: Secondary | ICD-10-CM | POA: Insufficient documentation

## 2023-08-11 DIAGNOSIS — R319 Hematuria, unspecified: Secondary | ICD-10-CM | POA: Diagnosis not present

## 2023-08-11 DIAGNOSIS — R109 Unspecified abdominal pain: Secondary | ICD-10-CM | POA: Diagnosis not present

## 2023-08-11 LAB — CBC
HCT: 42.7 % (ref 36.0–46.0)
Hemoglobin: 14.1 g/dL (ref 12.0–15.0)
MCH: 28.6 pg (ref 26.0–34.0)
MCHC: 33 g/dL (ref 30.0–36.0)
MCV: 86.6 fL (ref 80.0–100.0)
Platelets: 275 10*3/uL (ref 150–400)
RBC: 4.93 MIL/uL (ref 3.87–5.11)
RDW: 12 % (ref 11.5–15.5)
WBC: 11.5 10*3/uL — ABNORMAL HIGH (ref 4.0–10.5)
nRBC: 0 % (ref 0.0–0.2)

## 2023-08-11 LAB — COMPREHENSIVE METABOLIC PANEL
ALT: 21 U/L (ref 0–44)
AST: 21 U/L (ref 15–41)
Albumin: 4.2 g/dL (ref 3.5–5.0)
Alkaline Phosphatase: 70 U/L (ref 38–126)
Anion gap: 11 (ref 5–15)
BUN: 15 mg/dL (ref 8–23)
CO2: 25 mmol/L (ref 22–32)
Calcium: 9.7 mg/dL (ref 8.9–10.3)
Chloride: 101 mmol/L (ref 98–111)
Creatinine, Ser: 0.84 mg/dL (ref 0.44–1.00)
GFR, Estimated: >60 mL/min (ref >60)
Glucose, Bld: 117 mg/dL — ABNORMAL HIGH (ref 70–99)
Potassium: 4.1 mmol/L (ref 3.5–5.1)
Sodium: 137 mmol/L (ref 135–145)
Total Bilirubin: 0.9 mg/dL (ref 0.3–1.2)
Total Protein: 7.1 g/dL (ref 6.5–8.1)

## 2023-08-11 LAB — URINALYSIS, ROUTINE W REFLEX MICROSCOPIC
Bilirubin Urine: NEGATIVE
Glucose, UA: 50 mg/dL — AB
Ketones, ur: 80 mg/dL — AB
Nitrite: NEGATIVE
Protein, ur: 100 mg/dL — AB
RBC / HPF: >50 RBC/hpf (ref 0–5)
Specific Gravity, Urine: 1.017 (ref 1.005–1.030)
pH: 6 (ref 5.0–8.0)

## 2023-08-11 LAB — LIPASE, BLOOD: Lipase: 28 U/L (ref 11–51)

## 2023-08-11 MED ORDER — HYDROMORPHONE HCL 1 MG/ML IJ SOLN: 1.0000 mg | Freq: Once | INTRAMUSCULAR | Status: DC

## 2023-08-11 MED ORDER — TAMSULOSIN HCL 0.4 MG PO CAPS: 0.4000 mg | ORAL_CAPSULE | Freq: Every day | ORAL | 0 refills | Status: AC

## 2023-08-11 MED ORDER — ACETAMINOPHEN 325 MG PO TABS: 650.0000 mg | ORAL_TABLET | Freq: Four times a day (QID) | ORAL | 0 refills | Status: AC | PRN

## 2023-08-11 MED ORDER — ONDANSETRON HCL 4 MG/2ML IJ SOLN: 4.0000 mg | Freq: Once | INTRAMUSCULAR | Status: DC

## 2023-08-11 MED ORDER — IBUPROFEN 800 MG PO TABS: 800.0000 mg | ORAL_TABLET | Freq: Four times a day (QID) | ORAL | 0 refills | Status: AC | PRN

## 2023-08-11 MED ORDER — KETOROLAC TROMETHAMINE 30 MG/ML IJ SOLN
30.0000 mg | Freq: Once | INTRAMUSCULAR | Status: AC
  Administered 2023-08-11: 30 mg via INTRAVENOUS
  Filled 2023-08-11: qty 1

## 2023-08-11 MED ORDER — OXYCODONE-ACETAMINOPHEN 5-325 MG PO TABS
1.0000 | ORAL_TABLET | Freq: Four times a day (QID) | ORAL | 0 refills | Status: AC | PRN
Start: 2023-08-11 — End: ?

## 2023-08-11 NOTE — ED Provider Notes (Signed)
[place] EMERGENCY DEPARTMENT AT [facility] Provider Note   CSN: [id number] Arrival date & time: 08/11/23  1724     History  Chief Complaint  Patient presents with   Flank Pain    Misty Frederick is a 67 y.o. female with a history of nephrolithiasis, hypothyroidism, and asthma who presents the ED today for left flank pain and hematuria.  Patient reports intermittent hematuria for the past 2 months.  Patient was recently evaluated by her primary care provider and treated for UTI due to the hematuria.  She has been evaluated by urology in the past and had a CT done recently but she is supposed to see him on Wednesday and follow-up with the results and have a cystoscopy at that time as well.  Patient reports that for the past few weeks she has been having intermittent left flank pain.  The pain has become persistent today reports associated nausea, vomiting, and diarrhea.  She states that the vomit and diarrhea are "water."  She endorses intermittent dysuria and suprapubic discomfort.  She has not taken anything for pain.  No other complaints or concerns at this time.    Home Medications Prior to Admission medications   Medication Sig Start Date End Date Taking? Authorizing Provider  acetaminophen (TYLENOL) 325 MG tablet Take 2 tablets (650 mg total) by mouth every 6 (six) hours as needed for up to 14 days. 08/11/23 08/25/23 Yes Maxwell Marion, PA-C  ibuprofen (ADVIL) 800 MG tablet Take 1 tablet (800 mg total) by mouth every 6 (six) hours as needed for up to 14 days. 08/11/23 08/25/23 Yes Maxwell Marion, PA-C  oxyCODONE-acetaminophen (PERCOCET/ROXICET) 5-325 MG tablet Take 1 tablet by mouth every 6 (six) hours as needed for severe pain. 08/11/23  Yes Maxwell Marion, PA-C  tamsulosin (FLOMAX) 0.4 MG CAPS capsule Take 1 capsule (0.4 mg total) by mouth daily for 14 days. 08/11/23 08/25/23 Yes Maxwell Marion, PA-C  Cholecalciferol (VITAMIN D3) 1.25 MG (50000 UT) CAPS Vitamin D3    [provider]  cyanocobalamin 100 MCG tablet Take 100 mcg by mouth daily.    [provider]  cycloSPORINE, PF, (CEQUA) 0.09 % SOLN Cequa 0.09 % eye drops in a dropperette  Instill ONE drop into BOTH eyes TWICE daily    [provider]  Estradiol 10 MCG TABS vaginal tablet Vagifem 10 mcg vaginal tablet  INSERT 1 TABLET VAGINALLY TWICE A WEEK    [provider]  levothyroxine (SYNTHROID) 112 MCG tablet 1 tablet in the morning on an empty stomach    [provider]  LUTEIN PO Take by mouth.    [provider]  Omega-3 Fatty Acids (FISH OIL) 1000 MG CAPS Fish Oil    [provider]  ondansetron (ZOFRAN) 4 MG tablet Take 1 tablet (4 mg total) by mouth as directed. Take one Zofran pill 30-60 minutes before each colonoscopy prep dose 04/25/22   Hilarie Fredrickson, MD      Allergies    Macrobid [nitrofurantoin macrocrystal] and Morphine    Review of Systems   Review of Systems  Genitourinary:  Positive for flank pain.  All other systems reviewed and are negative.   Physical Exam Updated Vital Signs BP (!) 146/98 (BP Location: Right Arm)   Pulse 100   Temp 98.7 F (37.1 C) (Oral)   Resp 15   Ht 5\' 6"  (1.676 m)   Wt 65.8 kg   SpO2 98%   BMI 23.41 kg/m  Physical Exam  Vitals and nursing note reviewed.  Constitutional:      Appearance: Normal appearance.  HENT:     Head: Normocephalic and atraumatic.     Mouth/Throat:     Mouth: Mucous membranes are moist.  Eyes:     Conjunctiva/sclera: Conjunctivae normal.     Pupils: Pupils are equal, round, and reactive to light.  Cardiovascular:     Rate and Rhythm: Normal rate and regular rhythm.     Pulses: Normal pulses.     Heart sounds: Normal heart sounds.  Pulmonary:     Effort: Pulmonary effort is normal.     Breath sounds: Normal breath sounds.  Abdominal:     General: There is no distension.     Palpations: Abdomen is soft.     Tenderness: There is abdominal tenderness. There is  left CVA tenderness. There is no guarding or rebound.     Comments: Suprapubic tenderness on palpation as well as left CVA tenderness.  Skin:    General: Skin is warm and dry.     Findings: No rash.  Neurological:     General: No focal deficit present.     Mental Status: She is alert.     Sensory: No sensory deficit.     Motor: No weakness.  Psychiatric:        Mood and Affect: Mood normal.        Behavior: Behavior normal.     ED Results / Procedures / Treatments   Labs (all labs ordered are listed, but only abnormal results are displayed) Labs Reviewed  COMPREHENSIVE METABOLIC PANEL - Abnormal; Notable for the following components:      Result Value   Glucose, Bld 117 (*)    All other components within normal limits  CBC - Abnormal; Notable for the following components:   WBC 11.5 (*)    All other components within normal limits  URINALYSIS, ROUTINE W REFLEX MICROSCOPIC - Abnormal; Notable for the following components:   APPearance HAZY (*)    Glucose, UA 50 (*)    Hgb urine dipstick LARGE (*)    Ketones, ur 80 (*)    Protein, ur 100 (*)    Leukocytes,Ua TRACE (*)    Bacteria, UA RARE (*)    All other components within normal limits  LIPASE, BLOOD    EKG None  Radiology CT Renal Stone Study  Result Date: 08/11/2023 CLINICAL DATA:  Abdominal pain, flank pain EXAM: CT ABDOMEN AND PELVIS WITHOUT CONTRAST TECHNIQUE: Multidetector CT imaging of the abdomen and pelvis was performed following the standard protocol without IV contrast. RADIATION DOSE REDUCTION: This exam was performed according to the departmental dose-optimization program which includes automated exposure control, adjustment of the mA and/or kV according to patient size and/or use of iterative reconstruction technique. COMPARISON:  None Available. FINDINGS: Lower chest: No acute abnormality Hepatobiliary: No focal hepatic abnormality. Gallbladder unremarkable. Pancreas: No focal abnormality or ductal  dilatation. Spleen: No focal abnormality.  Normal size. Adrenals/Urinary Tract: Adrenal glands normal. Mild-to-moderate left hydronephrosis and hydroureter with perinephric stranding due to 4 mm left UVJ stone. No stones or hydronephrosis on the right. Urinary bladder unremarkable. Stomach/Bowel: Stomach, large and small bowel grossly unremarkable. Normal appendix. Vascular/Lymphatic: No evidence of aneurysm or adenopathy. Reproductive: Uterus and adnexa unremarkable.  No mass. Other: No free fluid or free air. Musculoskeletal: No acute bony abnormality. IMPRESSION: 4 mm left UVJ stone with mild to moderate left hydronephrosis and perinephric stranding. Electronically Signed   By: Charlett Nose  M.D.   On: 08/11/2023 20:02    Procedures Procedures: not indicated.   Medications Ordered in ED Medications  ketorolac (TORADOL) 30 MG/ML injection 30 mg (30 mg Intravenous Given 08/11/23 2156)    ED Course/ Medical Decision Making/ A&P                                 Medical Decision Making Amount and/or Complexity of Data Reviewed Labs: ordered. Radiology: ordered.  Risk Prescription drug management.   This patient presents to the ED for concern of left flank pain, this involves an extensive number of treatment options, and is a complaint that carries with it a high risk of complications and morbidity.   Differential diagnosis includes: UTI, pyelonephritis, urolithiasis, nephrolithiasis, IBS, IBD, diverticulitis, muscle strain, etc.   Comorbidities  See HPI above   Additional History  Additional history obtained from patient's previous records.  Lab Tests  I ordered and personally interpreted labs.  The pertinent results include:   CMP and CBC are within normal limits no AKI electrolyte abnormality acute infection or anemia. Lipase is unremarkable UA does not show signs of acute infection.   Imaging Studies  I ordered imaging studies including CT renal stone study I  independently visualized and interpreted imaging which showed: 4 mm left UVJ stone with mild to moderate left hydronephrosis and perinephric stranding. I agree with the radiologist interpretation   Problem List / ED Course / Critical Interventions / Medication Management  Left flank pain with hematuria I ordered medications including: Ketoralac for pain prior to discharge  I have reviewed the patients home medicines and have made adjustments as needed   Social Determinants of Health  Tobacco use   Test / Admission - Considered  Discussed findings with patient and family at bedside.  All questions were answered. Prescriptions for Flomax, Ibuprofen, Tylenol, and Percocet sent to the pharmacy. Patient is hemodynamically stable and safe for discharge home. Return precautions provided.         Final Clinical Impression(s) / ED Diagnoses Final diagnoses:  Kidney stone    Rx / DC Orders ED Discharge Orders          Ordered    tamsulosin (FLOMAX) 0.4 MG CAPS capsule  Daily        08/11/23 2140    ibuprofen (ADVIL) 800 MG tablet  Every 6 hours PRN        08/11/23 2140    acetaminophen (TYLENOL) 325 MG tablet  Every 6 hours PRN        08/11/23 2140    oxyCODONE-acetaminophen (PERCOCET/ROXICET) 5-325 MG tablet  Every 6 hours PRN        08/11/23 2140              Maxwell Marion, PA-C 08/12/23 0011    Charlynne Pander, MD 08/14/23 [id number]

## 2023-08-11 NOTE — Discharge Instructions (Addendum)
As discussed you have a 4 mm kidney stone. Please take Flomax daily. I have also sent in scripts for Tylenol and Ibuprofen to your pharmacy to take every 4-6 hours for pain.  If your pain persists.  You can take Percocet.  You can take it up to every 6 hours as needed.  Please call urology tomorrow to let them know/follow-up.  Get help right away if: You have a fever or chills. You get very bad pain. You get new pain in your belly. You faint. You cannot pee.

## 2023-08-11 NOTE — ED Provider Notes (Incomplete)
[place] EMERGENCY DEPARTMENT AT [facility] Provider Note   CSN: [id number] Arrival date & time: 08/11/23  1724     History {Add pertinent medical, surgical, social history, OB history to HPI:1} Chief Complaint  Patient presents with  . Flank Pain    Misty Frederick is a 67 y.o. female.   Flank Pain       Home Medications Prior to Admission medications   Medication Sig Start Date End Date Taking? Authorizing Provider  acetaminophen (TYLENOL) 325 MG tablet Take 2 tablets (650 mg total) by mouth every 6 (six) hours as needed for up to 14 days. 08/11/23 08/25/23 Yes Maxwell Marion, PA-C  ibuprofen (ADVIL) 800 MG tablet Take 1 tablet (800 mg total) by mouth every 6 (six) hours as needed for up to 14 days. 08/11/23 08/25/23 Yes Maxwell Marion, PA-C  oxyCODONE-acetaminophen (PERCOCET/ROXICET) 5-325 MG tablet Take 1 tablet by mouth every 6 (six) hours as needed for severe pain. 08/11/23  Yes Maxwell Marion, PA-C  tamsulosin (FLOMAX) 0.4 MG CAPS capsule Take 1 capsule (0.4 mg total) by mouth daily for 14 days. 08/11/23 08/25/23 Yes Maxwell Marion, PA-C  Cholecalciferol (VITAMIN D3) 1.25 MG (50000 UT) CAPS Vitamin D3    [provider]  cyanocobalamin 100 MCG tablet Take 100 mcg by mouth daily.    [provider]  cycloSPORINE, PF, (CEQUA) 0.09 % SOLN Cequa 0.09 % eye drops in a dropperette  Instill ONE drop into BOTH eyes TWICE daily    [provider]  Estradiol 10 MCG TABS vaginal tablet Vagifem 10 mcg vaginal tablet  INSERT 1 TABLET VAGINALLY TWICE A WEEK    [provider]  levothyroxine (SYNTHROID) 112 MCG tablet 1 tablet in the morning on an empty stomach    [provider]  LUTEIN PO Take by mouth.    [provider]  Omega-3 Fatty Acids (FISH OIL) 1000 MG CAPS Fish Oil    [provider]  ondansetron (ZOFRAN) 4 MG tablet Take 1 tablet (4 mg total) by mouth as directed. Take one Zofran pill 30-60 minutes  before each colonoscopy prep dose 04/25/22   Hilarie Fredrickson, MD      Allergies    Macrobid [nitrofurantoin macrocrystal] and Morphine    Review of Systems   Review of Systems  Genitourinary:  Positive for flank pain.  All other systems reviewed and are negative.   Physical Exam Updated Vital Signs BP (!) 146/98 (BP Location: Right Arm)   Pulse 100   Temp 98.7 F (37.1 C) (Oral)   Resp 15   Ht 5\' 6"  (1.676 m)   Wt 65.8 kg   SpO2 98%   BMI 23.41 kg/m  Physical Exam Vitals and nursing note reviewed.  Constitutional:      Appearance: Normal appearance.  HENT:     Head: Normocephalic and atraumatic.     Mouth/Throat:     Mouth: Mucous membranes are moist.  Eyes:     Conjunctiva/sclera: Conjunctivae normal.     Pupils: Pupils are equal, round, and reactive to light.  Cardiovascular:     Rate and Rhythm: Normal rate and regular rhythm.     Pulses: Normal pulses.  Pulmonary:     Effort: Pulmonary effort is normal.     Breath sounds: Normal breath sounds.  Abdominal:     Palpations: Abdomen is soft.     Tenderness: There is no abdominal tenderness.  Skin:    General: Skin is warm and dry.  Findings: No rash.  Neurological:     General: No focal deficit present.     Mental Status: She is alert.  Psychiatric:        Mood and Affect: Mood normal.        Behavior: Behavior normal.     ED Results / Procedures / Treatments   Labs (all labs ordered are listed, but only abnormal results are displayed) Labs Reviewed  COMPREHENSIVE METABOLIC PANEL - Abnormal; Notable for the following components:      Result Value   Glucose, Bld 117 (*)    All other components within normal limits  CBC - Abnormal; Notable for the following components:   WBC 11.5 (*)    All other components within normal limits  URINALYSIS, ROUTINE W REFLEX MICROSCOPIC - Abnormal; Notable for the following components:   APPearance HAZY (*)    Glucose, UA 50 (*)    Hgb urine dipstick LARGE (*)     Ketones, ur 80 (*)    Protein, ur 100 (*)    Leukocytes,Ua TRACE (*)    Bacteria, UA RARE (*)    All other components within normal limits  LIPASE, BLOOD    EKG None  Radiology CT Renal Stone Study  Result Date: 08/11/2023 CLINICAL DATA:  Abdominal pain, flank pain EXAM: CT ABDOMEN AND PELVIS WITHOUT CONTRAST TECHNIQUE: Multidetector CT imaging of the abdomen and pelvis was performed following the standard protocol without IV contrast. RADIATION DOSE REDUCTION: This exam was performed according to the departmental dose-optimization program which includes automated exposure control, adjustment of the mA and/or kV according to patient size and/or use of iterative reconstruction technique. COMPARISON:  None Available. FINDINGS: Lower chest: No acute abnormality Hepatobiliary: No focal hepatic abnormality. Gallbladder unremarkable. Pancreas: No focal abnormality or ductal dilatation. Spleen: No focal abnormality.  Normal size. Adrenals/Urinary Tract: Adrenal glands normal. Mild-to-moderate left hydronephrosis and hydroureter with perinephric stranding due to 4 mm left UVJ stone. No stones or hydronephrosis on the right. Urinary bladder unremarkable. Stomach/Bowel: Stomach, large and small bowel grossly unremarkable. Normal appendix. Vascular/Lymphatic: No evidence of aneurysm or adenopathy. Reproductive: Uterus and adnexa unremarkable.  No mass. Other: No free fluid or free air. Musculoskeletal: No acute bony abnormality. IMPRESSION: 4 mm left UVJ stone with mild to moderate left hydronephrosis and perinephric stranding. Electronically Signed   By: Charlett Nose M.D.   On: 08/11/2023 20:02    Procedures Procedures: not indicated. {Document cardiac monitor, telemetry assessment procedure when appropriate:1}  Medications Ordered in ED Medications  ketorolac (TORADOL) 30 MG/ML injection 30 mg (30 mg Intravenous Given 08/11/23 2156)    ED Course/ Medical Decision Making/ A&P   {   Click here for  ABCD2, HEART and other calculatorsREFRESH Note before signing :1}                              Medical Decision Making Amount and/or Complexity of Data Reviewed Labs: ordered. Radiology: ordered.  Risk Prescription drug management.   This patient presents to the ED for concern of left flank pain, this involves an extensive number of treatment options, and is a complaint that carries with it a high risk of complications and morbidity.   Differential diagnosis includes: ***   Comorbidities  See HPI above   Additional History  Additional history obtained from patient's previous records.  Lab Tests  I ordered and personally interpreted labs.  The pertinent results include:   CMP and  CBC are within normal limits no AKI electrolyte abnormality acute infection or anemia. Lipase is unremarkable UA does not show signs of acute infection.   Imaging Studies  I ordered imaging studies including CT renal stone study I independently visualized and interpreted imaging which showed: *** I agree with the radiologist interpretation   Problem List / ED Course / Critical Interventions / Medication Management  Left flank pain with hematuria I ordered medications including: Ketoralac for pain prior to discharge  I have reviewed the patients home medicines and have made adjustments as needed   Social Determinants of Health  Tobacco use   Test / Admission - Considered  ***   {Document critical care time when appropriate:1} {Document review of labs and clinical decision tools ie heart score, Chads2Vasc2 etc:1}  {Document your independent review of radiology images, and any outside records:1} {Document your discussion with family members, caretakers, and with consultants:1} {Document social determinants of health affecting pt's care:1} {Document your decision making why or why not admission, treatments were needed:1} Final Clinical Impression(s) / ED Diagnoses Final diagnoses:   Kidney stone    Rx / DC Orders ED Discharge Orders          Ordered    tamsulosin (FLOMAX) 0.4 MG CAPS capsule  Daily        08/11/23 2140    ibuprofen (ADVIL) 800 MG tablet  Every 6 hours PRN        08/11/23 2140    acetaminophen (TYLENOL) 325 MG tablet  Every 6 hours PRN        08/11/23 2140    oxyCODONE-acetaminophen (PERCOCET/ROXICET) 5-325 MG tablet  Every 6 hours PRN        08/11/23 2140

## 2023-08-11 NOTE — ED Provider Triage Note (Signed)
Emergency Medicine Provider Triage Evaluation Note  Misty Frederick , a 67 y.o. female  was evaluated in triage.  Pt complains of constant left flank pain. Intermittent suprapubic pain and hematuria.  Initially symptoms were intermittent for the past 2 months, but the flank pain has been persistent as of today. She is "pooping and throwing up water" as of today.  Review of Systems  Positive: Hematuria, dysuria, left flank pain, vomiting Negative: Fever  Physical Exam  BP (!) 161/77 (BP Location: Right Arm)   Pulse 82   Temp 98.9 F (37.2 C) (Oral)   Resp 18   Ht 5\' 6"  (1.676 m)   Wt 65.8 kg   SpO2 100%   BMI 23.41 kg/m  Gen:   Awake, no distress   Resp:  Normal effort  MSK:   Moves extremities without difficulty  Other:    Medical Decision Making  Medically screening exam initiated at 5:55 PM.  Appropriate orders placed.  Misty Frederick was informed that the remainder of the evaluation will be completed by another provider, this initial triage assessment does not replace that evaluation, and the importance of remaining in the ED until their evaluation is complete.   Maxwell Marion, PA-C 08/11/23 1800

## 2023-08-11 NOTE — ED Triage Notes (Signed)
Patient brought in by EMS due to left sided flank pain. Reports this has been going on for a few weeks, but became way worse in pain today. Pt reports nausea and vomiting today and diarrhea as well. Pt states she had a CT scan recently but was not told the results.

## 2023-08-28 DIAGNOSIS — R311 Benign essential microscopic hematuria: Secondary | ICD-10-CM | POA: Diagnosis not present

## 2023-08-28 DIAGNOSIS — N201 Calculus of ureter: Secondary | ICD-10-CM | POA: Diagnosis not present

## 2023-08-28 DIAGNOSIS — R310 Gross hematuria: Secondary | ICD-10-CM | POA: Diagnosis not present

## 2023-08-28 DIAGNOSIS — R300 Dysuria: Secondary | ICD-10-CM | POA: Diagnosis not present

## 2023-08-29 DIAGNOSIS — N898 Other specified noninflammatory disorders of vagina: Secondary | ICD-10-CM | POA: Diagnosis not present

## 2023-09-02 DIAGNOSIS — H00021 Hordeolum internum right upper eyelid: Secondary | ICD-10-CM | POA: Diagnosis not present

## 2023-09-13 ENCOUNTER — Telehealth: Payer: Self-pay

## 2023-09-13 NOTE — Telephone Encounter (Signed)
Transition Care Management Unsuccessful Follow-up Telephone Call  Date of discharge and from where:  08/11/2023 [facility]  Attempts:  1st Attempt  Reason for unsuccessful TCM follow-up call:  Left voice message  Mishel Sans [facility]  [facility], [facility] Resource Care Guide Direct Dial: [phone]  Website: [email or web address]

## 2023-09-16 ENCOUNTER — Telehealth: Payer: Self-pay

## 2023-09-16 NOTE — Telephone Encounter (Signed)
Transition Care Management Unsuccessful Follow-up Telephone Call  Date of discharge and from where:  08/11/2023 [facility]  Attempts:  2nd Attempt  Reason for unsuccessful TCM follow-up call:  No answer/busy  Adriene Knipfer [facility]  [facility], [facility] Resource Care Guide Direct Dial: [phone]  Website: [email or web address]

## 2023-10-15 DIAGNOSIS — B351 Tinea unguium: Secondary | ICD-10-CM | POA: Diagnosis not present

## 2023-10-15 DIAGNOSIS — L814 Other melanin hyperpigmentation: Secondary | ICD-10-CM | POA: Diagnosis not present

## 2023-10-15 DIAGNOSIS — D2271 Melanocytic nevi of right lower limb, including hip: Secondary | ICD-10-CM | POA: Diagnosis not present

## 2023-10-15 DIAGNOSIS — L578 Other skin changes due to chronic exposure to nonionizing radiation: Secondary | ICD-10-CM | POA: Diagnosis not present

## 2023-10-15 DIAGNOSIS — Z85828 Personal history of other malignant neoplasm of skin: Secondary | ICD-10-CM | POA: Diagnosis not present

## 2023-10-15 DIAGNOSIS — D225 Melanocytic nevi of trunk: Secondary | ICD-10-CM | POA: Diagnosis not present

## 2023-10-15 DIAGNOSIS — L821 Other seborrheic keratosis: Secondary | ICD-10-CM | POA: Diagnosis not present

## 2024-02-26 DIAGNOSIS — N952 Postmenopausal atrophic vaginitis: Secondary | ICD-10-CM | POA: Diagnosis not present

## 2024-02-26 DIAGNOSIS — R310 Gross hematuria: Secondary | ICD-10-CM | POA: Diagnosis not present

## 2024-02-26 DIAGNOSIS — Z87442 Personal history of urinary calculi: Secondary | ICD-10-CM | POA: Diagnosis not present

## 2024-02-27 ENCOUNTER — Other Ambulatory Visit: Payer: Self-pay | Admitting: Obstetrics and Gynecology

## 2024-02-27 DIAGNOSIS — Z1231 Encounter for screening mammogram for malignant neoplasm of breast: Secondary | ICD-10-CM

## 2024-03-19 DIAGNOSIS — H16223 Keratoconjunctivitis sicca, not specified as Sjogren's, bilateral: Secondary | ICD-10-CM | POA: Diagnosis not present

## 2024-03-19 DIAGNOSIS — H2513 Age-related nuclear cataract, bilateral: Secondary | ICD-10-CM | POA: Diagnosis not present

## 2024-03-24 ENCOUNTER — Ambulatory Visit
Admission: RE | Admit: 2024-03-24 | Discharge: 2024-03-24 | Disposition: A | Discharge status: Home / Self Care | Source: Ambulatory Visit | Attending: Obstetrics and Gynecology | Admitting: Obstetrics and Gynecology

## 2024-03-24 DIAGNOSIS — Z1231 Encounter for screening mammogram for malignant neoplasm of breast: Secondary | ICD-10-CM | POA: Diagnosis not present

## 2024-04-02 DIAGNOSIS — N200 Calculus of kidney: Secondary | ICD-10-CM | POA: Diagnosis not present

## 2024-04-21 DIAGNOSIS — R7301 Impaired fasting glucose: Secondary | ICD-10-CM | POA: Diagnosis not present

## 2024-04-21 DIAGNOSIS — Z1212 Encounter for screening for malignant neoplasm of rectum: Secondary | ICD-10-CM | POA: Diagnosis not present

## 2024-04-21 DIAGNOSIS — E039 Hypothyroidism, unspecified: Secondary | ICD-10-CM | POA: Diagnosis not present

## 2024-04-21 DIAGNOSIS — E559 Vitamin D deficiency, unspecified: Secondary | ICD-10-CM | POA: Diagnosis not present

## 2024-04-21 DIAGNOSIS — R030 Elevated blood-pressure reading, without diagnosis of hypertension: Secondary | ICD-10-CM | POA: Diagnosis not present

## 2024-04-28 ENCOUNTER — Other Ambulatory Visit: Payer: Self-pay | Admitting: Endocrinology

## 2024-04-28 DIAGNOSIS — Z0000 Encounter for general adult medical examination without abnormal findings: Secondary | ICD-10-CM | POA: Diagnosis not present

## 2024-04-28 DIAGNOSIS — J45909 Unspecified asthma, uncomplicated: Secondary | ICD-10-CM | POA: Diagnosis not present

## 2024-04-28 DIAGNOSIS — M4186 Other forms of scoliosis, lumbar region: Secondary | ICD-10-CM | POA: Diagnosis not present

## 2024-04-28 DIAGNOSIS — Z87442 Personal history of urinary calculi: Secondary | ICD-10-CM | POA: Diagnosis not present

## 2024-04-28 DIAGNOSIS — R7301 Impaired fasting glucose: Secondary | ICD-10-CM | POA: Diagnosis not present

## 2024-04-28 DIAGNOSIS — R002 Palpitations: Secondary | ICD-10-CM | POA: Diagnosis not present

## 2024-04-28 DIAGNOSIS — R030 Elevated blood-pressure reading, without diagnosis of hypertension: Secondary | ICD-10-CM | POA: Diagnosis not present

## 2024-04-28 DIAGNOSIS — E559 Vitamin D deficiency, unspecified: Secondary | ICD-10-CM | POA: Diagnosis not present

## 2024-04-28 DIAGNOSIS — E039 Hypothyroidism, unspecified: Secondary | ICD-10-CM | POA: Diagnosis not present

## 2024-04-28 DIAGNOSIS — M5416 Radiculopathy, lumbar region: Secondary | ICD-10-CM | POA: Diagnosis not present

## 2024-04-28 DIAGNOSIS — R82998 Other abnormal findings in urine: Secondary | ICD-10-CM | POA: Diagnosis not present

## 2024-04-28 DIAGNOSIS — K635 Polyp of colon: Secondary | ICD-10-CM | POA: Diagnosis not present

## 2024-04-28 DIAGNOSIS — M5412 Radiculopathy, cervical region: Secondary | ICD-10-CM | POA: Diagnosis not present

## 2024-05-08 ENCOUNTER — Ambulatory Visit
Admission: RE | Admit: 2024-05-08 | Discharge: 2024-05-08 | Disposition: A | Discharge status: Home / Self Care | Source: Ambulatory Visit | Attending: Endocrinology | Admitting: Endocrinology

## 2024-05-08 DIAGNOSIS — Z0000 Encounter for general adult medical examination without abnormal findings: Secondary | ICD-10-CM

## 2024-08-28 DIAGNOSIS — Z87442 Personal history of urinary calculi: Secondary | ICD-10-CM | POA: Diagnosis not present

## 2024-08-28 DIAGNOSIS — N200 Calculus of kidney: Secondary | ICD-10-CM | POA: Diagnosis not present

## 2024-08-28 DIAGNOSIS — R399 Unspecified symptoms and signs involving the genitourinary system: Secondary | ICD-10-CM | POA: Diagnosis not present

## 2024-09-14 DIAGNOSIS — R197 Diarrhea, unspecified: Secondary | ICD-10-CM | POA: Diagnosis not present

## 2024-09-14 DIAGNOSIS — K635 Polyp of colon: Secondary | ICD-10-CM | POA: Diagnosis not present

## 2024-12-22 ENCOUNTER — Ambulatory Visit: Payer: Self-pay | Admitting: Physician Assistant
